# Patient Record
Sex: Female | Born: 1950 | Race: White | Hispanic: No | Marital: Married | Smoking: Never smoker
Health system: Southern US, Community
[De-identification: ages and names within clinical notes are randomized; demographics above are authoritative.]

## PROBLEM LIST (undated history)

## (undated) DIAGNOSIS — K219 Gastro-esophageal reflux disease without esophagitis: Secondary | ICD-10-CM

## (undated) DIAGNOSIS — Z9221 Personal history of antineoplastic chemotherapy: Secondary | ICD-10-CM

## (undated) DIAGNOSIS — Z9889 Other specified postprocedural states: Secondary | ICD-10-CM

## (undated) DIAGNOSIS — D649 Anemia, unspecified: Secondary | ICD-10-CM

## (undated) DIAGNOSIS — IMO0001 Reserved for inherently not codable concepts without codable children: Secondary | ICD-10-CM

## (undated) DIAGNOSIS — C541 Malignant neoplasm of endometrium: Secondary | ICD-10-CM

## (undated) DIAGNOSIS — IMO0002 Reserved for concepts with insufficient information to code with codable children: Secondary | ICD-10-CM

## (undated) DIAGNOSIS — K5790 Diverticulosis of intestine, part unspecified, without perforation or abscess without bleeding: Secondary | ICD-10-CM

## (undated) DIAGNOSIS — Z923 Personal history of irradiation: Secondary | ICD-10-CM

## (undated) HISTORY — PX: OVARIAN CYST REMOVAL: SHX89

## (undated) HISTORY — DX: Gastro-esophageal reflux disease without esophagitis: K21.9

## (undated) HISTORY — DX: Reserved for inherently not codable concepts without codable children: IMO0001

## (undated) HISTORY — PX: OTHER SURGICAL HISTORY: SHX169

## (undated) HISTORY — DX: Personal history of antineoplastic chemotherapy: Z92.21

## (undated) HISTORY — PX: BRAIN SURGERY: SHX531

## (undated) HISTORY — PX: TUBAL LIGATION: SHX77

## (undated) HISTORY — DX: Diverticulosis of intestine, part unspecified, without perforation or abscess without bleeding: K57.90

## (undated) HISTORY — PX: ABDOMINAL HYSTERECTOMY: SHX81

## (undated) HISTORY — DX: Reserved for concepts with insufficient information to code with codable children: IMO0002

## (undated) HISTORY — DX: Malignant neoplasm of endometrium: C54.1

## (undated) HISTORY — DX: Personal history of irradiation: Z92.3

## (undated) HISTORY — DX: Other specified postprocedural states: Z98.890

---

## 2014-01-01 ENCOUNTER — Ambulatory Visit: Payer: Self-pay | Admitting: Internal Medicine

## 2014-01-01 LAB — CBC CANCER CENTER
Bands: 3 %
Basophil: 1 %
HCT: 19.3 % — AB (ref 35.0–47.0)
HGB: 6.1 g/dL — AB (ref 12.0–16.0)
LYMPHS PCT: 15 %
MCH: 20.3 pg — AB (ref 26.0–34.0)
MCHC: 31.4 g/dL — AB (ref 32.0–36.0)
MCV: 65 fL — ABNORMAL LOW (ref 80–100)
Monocytes: 5 %
PLATELETS: 563 x10 3/mm — AB (ref 150–440)
RBC: 2.98 10*6/uL — ABNORMAL LOW (ref 3.80–5.20)
RDW: 19.9 % — ABNORMAL HIGH (ref 11.5–14.5)
SEGMENTED NEUTROPHILS: 76 %
WBC: 10.3 x10 3/mm (ref 3.6–11.0)

## 2014-01-01 LAB — FOLATE: FOLIC ACID: 9.6 ng/mL (ref 3.1–100.0)

## 2014-01-01 LAB — RETICULOCYTES
Absolute Retic Count: 0.0548 10*6/uL (ref 0.019–0.186)
Reticulocyte: 1.84 % (ref 0.4–3.1)

## 2014-01-01 LAB — IRON AND TIBC
Iron Bind.Cap.(Total): 375 ug/dL (ref 250–450)
Iron Saturation: 4 %
Iron: 14 ug/dL — ABNORMAL LOW (ref 50–170)
Unbound Iron-Bind.Cap.: 361 ug/dL

## 2014-01-01 LAB — FERRITIN: Ferritin (ARMC): 6 ng/mL — ABNORMAL LOW (ref 8–388)

## 2014-01-01 LAB — LACTATE DEHYDROGENASE: LDH: 149 U/L (ref 81–246)

## 2014-01-02 ENCOUNTER — Ambulatory Visit: Payer: Self-pay | Admitting: Gastroenterology

## 2014-01-03 ENCOUNTER — Ambulatory Visit: Payer: Self-pay | Admitting: Internal Medicine

## 2014-01-03 LAB — PROT IMMUNOELECTROPHORES(ARMC)

## 2014-01-07 ENCOUNTER — Ambulatory Visit: Payer: Self-pay | Admitting: Gastroenterology

## 2014-01-08 LAB — CBC CANCER CENTER
BASOS ABS: 0.1 x10 3/mm (ref 0.0–0.1)
Basophil %: 0.7 %
EOS PCT: 3.3 %
Eosinophil #: 0.3 x10 3/mm (ref 0.0–0.7)
HCT: 28.8 % — AB (ref 35.0–47.0)
HGB: 9 g/dL — ABNORMAL LOW (ref 12.0–16.0)
LYMPHS ABS: 1.2 x10 3/mm (ref 1.0–3.6)
LYMPHS PCT: 12.9 %
MCH: 22.5 pg — ABNORMAL LOW (ref 26.0–34.0)
MCHC: 31.2 g/dL — ABNORMAL LOW (ref 32.0–36.0)
MCV: 72 fL — ABNORMAL LOW (ref 80–100)
MONO ABS: 0.8 x10 3/mm (ref 0.2–0.9)
MONOS PCT: 8.5 %
Neutrophil #: 6.8 x10 3/mm — ABNORMAL HIGH (ref 1.4–6.5)
Neutrophil %: 74.6 %
Platelet: 502 x10 3/mm — ABNORMAL HIGH (ref 150–440)
RBC: 4.01 10*6/uL (ref 3.80–5.20)
RDW: 23.9 % — ABNORMAL HIGH (ref 11.5–14.5)
WBC: 9.1 x10 3/mm (ref 3.6–11.0)

## 2014-01-09 LAB — CA 125: CA 125: 29.6 U/mL (ref 0.0–34.0)

## 2014-01-21 LAB — PATHOLOGY REPORT

## 2014-01-23 ENCOUNTER — Ambulatory Visit: Payer: Self-pay | Admitting: Gynecologic Oncology

## 2014-01-23 DIAGNOSIS — D508 Other iron deficiency anemias: Secondary | ICD-10-CM

## 2014-01-23 LAB — BASIC METABOLIC PANEL
ANION GAP: 10 (ref 7–16)
BUN: 11 mg/dL (ref 7–18)
CO2: 28 mmol/L (ref 21–32)
CREATININE: 0.8 mg/dL (ref 0.60–1.30)
Calcium, Total: 9.1 mg/dL (ref 8.5–10.1)
Chloride: 99 mmol/L (ref 98–107)
EGFR (African American): >60
EGFR (Non-African Amer.): >60
GLUCOSE: 109 mg/dL — AB (ref 65–99)
OSMOLALITY: 274 (ref 275–301)
Potassium: 3.8 mmol/L (ref 3.5–5.1)
Sodium: 137 mmol/L (ref 136–145)

## 2014-01-23 LAB — HEMOGLOBIN: HGB: 9.2 g/dL — AB (ref 12.0–16.0)

## 2014-01-28 ENCOUNTER — Ambulatory Visit: Payer: Self-pay | Admitting: Gynecologic Oncology

## 2014-01-28 LAB — HEMOGLOBIN: HGB: 9.8 g/dL — ABNORMAL LOW (ref 12.0–16.0)

## 2014-02-03 ENCOUNTER — Ambulatory Visit: Payer: Self-pay | Admitting: Internal Medicine

## 2014-02-03 LAB — CBC CANCER CENTER
BASOS ABS: 0.1 x10 3/mm (ref 0.0–0.1)
BASOS PCT: 0.6 %
Eosinophil #: 0.5 x10 3/mm (ref 0.0–0.7)
Eosinophil %: 4.7 %
HCT: 27.2 % — ABNORMAL LOW (ref 35.0–47.0)
HGB: 8.4 g/dL — ABNORMAL LOW (ref 12.0–16.0)
LYMPHS ABS: 1.5 x10 3/mm (ref 1.0–3.6)
Lymphocyte %: 14.4 %
MCH: 23.3 pg — ABNORMAL LOW (ref 26.0–34.0)
MCHC: 31.1 g/dL — AB (ref 32.0–36.0)
MCV: 75 fL — ABNORMAL LOW (ref 80–100)
MONOS PCT: 9.3 %
Monocyte #: 1 x10 3/mm — ABNORMAL HIGH (ref 0.2–0.9)
NEUTROS PCT: 71 %
Neutrophil #: 7.3 x10 3/mm — ABNORMAL HIGH (ref 1.4–6.5)
Platelet: 422 x10 3/mm (ref 150–440)
RBC: 3.63 10*6/uL — AB (ref 3.80–5.20)
RDW: 25.3 % — ABNORMAL HIGH (ref 11.5–14.5)
WBC: 10.3 x10 3/mm (ref 3.6–11.0)

## 2014-02-03 LAB — COMPREHENSIVE METABOLIC PANEL
ALBUMIN: 3.4 g/dL (ref 3.4–5.0)
Alkaline Phosphatase: 83 U/L
Anion Gap: 12 (ref 7–16)
BUN: 8 mg/dL (ref 7–18)
Bilirubin,Total: 0.4 mg/dL (ref 0.2–1.0)
CHLORIDE: 99 mmol/L (ref 98–107)
Calcium, Total: 9.8 mg/dL (ref 8.5–10.1)
Co2: 29 mmol/L (ref 21–32)
Creatinine: 0.94 mg/dL (ref 0.60–1.30)
Glucose: 105 mg/dL — ABNORMAL HIGH (ref 65–99)
Osmolality: 278 (ref 275–301)
POTASSIUM: 3.4 mmol/L — AB (ref 3.5–5.1)
SGOT(AST): 11 U/L — ABNORMAL LOW (ref 15–37)
SGPT (ALT): 13 U/L (ref 12–78)
Sodium: 140 mmol/L (ref 136–145)
Total Protein: 8.2 g/dL (ref 6.4–8.2)

## 2014-02-05 LAB — CANCER CENTER HEMOGLOBIN: HGB: 8.4 g/dL — AB (ref 12.0–16.0)

## 2014-02-08 ENCOUNTER — Inpatient Hospital Stay: Payer: Self-pay | Admitting: Internal Medicine

## 2014-02-08 LAB — CBC
HCT: 23.1 % — AB (ref 35.0–47.0)
HGB: 7.2 g/dL — AB (ref 12.0–16.0)
MCH: 23.4 pg — ABNORMAL LOW (ref 26.0–34.0)
MCHC: 31.1 g/dL — ABNORMAL LOW (ref 32.0–36.0)
MCV: 75 fL — AB (ref 80–100)
Platelet: 381 10*3/uL (ref 150–440)
RBC: 3.08 10*6/uL — AB (ref 3.80–5.20)
RDW: 25.6 % — ABNORMAL HIGH (ref 11.5–14.5)
WBC: 10.8 10*3/uL (ref 3.6–11.0)

## 2014-02-08 LAB — BASIC METABOLIC PANEL
ANION GAP: 9 (ref 7–16)
BUN: 10 mg/dL (ref 7–18)
Calcium, Total: 8.5 mg/dL (ref 8.5–10.1)
Chloride: 105 mmol/L (ref 98–107)
Co2: 25 mmol/L (ref 21–32)
Creatinine: 0.93 mg/dL (ref 0.60–1.30)
EGFR (Non-African Amer.): >60
Glucose: 118 mg/dL — ABNORMAL HIGH (ref 65–99)
Osmolality: 278 (ref 275–301)
Potassium: 3.1 mmol/L — ABNORMAL LOW (ref 3.5–5.1)
Sodium: 139 mmol/L (ref 136–145)

## 2014-02-09 LAB — CBC WITH DIFFERENTIAL/PLATELET
BASOS ABS: 0 10*3/uL (ref 0.0–0.1)
Basophil %: 0.6 %
EOS ABS: 0.4 10*3/uL (ref 0.0–0.7)
EOS PCT: 4.8 %
HCT: 21.7 % — ABNORMAL LOW (ref 35.0–47.0)
HGB: 6.9 g/dL — ABNORMAL LOW (ref 12.0–16.0)
LYMPHS ABS: 1.3 10*3/uL (ref 1.0–3.6)
LYMPHS PCT: 14.2 %
MCH: 24.4 pg — ABNORMAL LOW (ref 26.0–34.0)
MCHC: 31.7 g/dL — AB (ref 32.0–36.0)
MCV: 77 fL — ABNORMAL LOW (ref 80–100)
MONO ABS: 0.3 x10 3/mm (ref 0.2–0.9)
Monocyte %: 3.6 %
Neutrophil #: 6.8 10*3/uL — ABNORMAL HIGH (ref 1.4–6.5)
Neutrophil %: 76.8 %
PLATELETS: 306 10*3/uL (ref 150–440)
RBC: 2.82 10*6/uL — ABNORMAL LOW (ref 3.80–5.20)
RDW: 25.7 % — ABNORMAL HIGH (ref 11.5–14.5)
WBC: 8.8 10*3/uL (ref 3.6–11.0)

## 2014-02-09 LAB — BASIC METABOLIC PANEL
Anion Gap: 8 (ref 7–16)
BUN: 11 mg/dL (ref 7–18)
Calcium, Total: 7.9 mg/dL — ABNORMAL LOW (ref 8.5–10.1)
Chloride: 106 mmol/L (ref 98–107)
Co2: 23 mmol/L (ref 21–32)
Creatinine: 0.61 mg/dL (ref 0.60–1.30)
EGFR (Non-African Amer.): >60
Glucose: 100 mg/dL — ABNORMAL HIGH (ref 65–99)
Osmolality: 273 (ref 275–301)
Potassium: 3.1 mmol/L — ABNORMAL LOW (ref 3.5–5.1)
Sodium: 137 mmol/L (ref 136–145)

## 2014-02-10 LAB — CBC WITH DIFFERENTIAL/PLATELET
BASOS PCT: 0.7 %
Basophil #: 0.1 10*3/uL (ref 0.0–0.1)
Eosinophil #: 0.7 10*3/uL (ref 0.0–0.7)
Eosinophil %: 7.4 %
HCT: 25.7 % — ABNORMAL LOW (ref 35.0–47.0)
HGB: 8.2 g/dL — ABNORMAL LOW (ref 12.0–16.0)
LYMPHS PCT: 9.9 %
Lymphocyte #: 0.9 10*3/uL — ABNORMAL LOW (ref 1.0–3.6)
MCH: 25.2 pg — ABNORMAL LOW (ref 26.0–34.0)
MCHC: 32 g/dL (ref 32.0–36.0)
MCV: 79 fL — ABNORMAL LOW (ref 80–100)
Monocyte #: 0.3 x10 3/mm (ref 0.2–0.9)
Monocyte %: 3.1 %
NEUTROS ABS: 7.6 10*3/uL — AB (ref 1.4–6.5)
NEUTROS PCT: 78.9 %
PLATELETS: 301 10*3/uL (ref 150–440)
RBC: 3.26 10*6/uL — AB (ref 3.80–5.20)
RDW: 23.7 % — ABNORMAL HIGH (ref 11.5–14.5)
WBC: 9.6 10*3/uL (ref 3.6–11.0)

## 2014-02-10 LAB — POTASSIUM: POTASSIUM: 3.2 mmol/L — AB (ref 3.5–5.1)

## 2014-02-11 LAB — CBC WITH DIFFERENTIAL/PLATELET
BASOS PCT: 0.4 %
Basophil #: 0 10*3/uL (ref 0.0–0.1)
EOS ABS: 0.8 10*3/uL — AB (ref 0.0–0.7)
Eosinophil %: 10 %
HCT: 26.2 % — ABNORMAL LOW (ref 35.0–47.0)
HGB: 8.5 g/dL — AB (ref 12.0–16.0)
Lymphocyte #: 1.2 10*3/uL (ref 1.0–3.6)
Lymphocyte %: 15.8 %
MCH: 25.3 pg — ABNORMAL LOW (ref 26.0–34.0)
MCHC: 32.3 g/dL (ref 32.0–36.0)
MCV: 78 fL — ABNORMAL LOW (ref 80–100)
Monocyte #: 0.4 x10 3/mm (ref 0.2–0.9)
Monocyte %: 5.1 %
NEUTROS PCT: 68.7 %
Neutrophil #: 5.2 10*3/uL (ref 1.4–6.5)
Platelet: 380 10*3/uL (ref 150–440)
RBC: 3.34 10*6/uL — ABNORMAL LOW (ref 3.80–5.20)
RDW: 24.2 % — AB (ref 11.5–14.5)
WBC: 7.6 10*3/uL (ref 3.6–11.0)

## 2014-02-12 LAB — BASIC METABOLIC PANEL
ANION GAP: 13 (ref 7–16)
BUN: 8 mg/dL (ref 7–18)
CHLORIDE: 100 mmol/L (ref 98–107)
CREATININE: 0.86 mg/dL (ref 0.60–1.30)
Calcium, Total: 9.1 mg/dL (ref 8.5–10.1)
Co2: 22 mmol/L (ref 21–32)
EGFR (African American): >60
Glucose: 126 mg/dL — ABNORMAL HIGH (ref 65–99)
Osmolality: 270 (ref 275–301)
Potassium: 3.5 mmol/L (ref 3.5–5.1)
SODIUM: 135 mmol/L — AB (ref 136–145)

## 2014-02-12 LAB — CBC CANCER CENTER
BASOS PCT: 0.4 %
Basophil #: 0 x10 3/mm (ref 0.0–0.1)
Eosinophil #: 0 x10 3/mm (ref 0.0–0.7)
Eosinophil %: 0 %
HCT: 27.4 % — AB (ref 35.0–47.0)
HGB: 8.9 g/dL — ABNORMAL LOW (ref 12.0–16.0)
Lymphocyte #: 0.4 x10 3/mm — ABNORMAL LOW (ref 1.0–3.6)
Lymphocyte %: 7.1 %
MCH: 25.2 pg — ABNORMAL LOW (ref 26.0–34.0)
MCHC: 32.4 g/dL (ref 32.0–36.0)
MCV: 78 fL — ABNORMAL LOW (ref 80–100)
Monocyte #: 0.2 x10 3/mm (ref 0.2–0.9)
Monocyte %: 4.1 %
NEUTROS ABS: 4.4 x10 3/mm (ref 1.4–6.5)
Neutrophil %: 88.4 %
Platelet: 426 x10 3/mm (ref 150–440)
RBC: 3.52 10*6/uL — ABNORMAL LOW (ref 3.80–5.20)
RDW: 24.8 % — ABNORMAL HIGH (ref 11.5–14.5)
WBC: 5 x10 3/mm (ref 3.6–11.0)

## 2014-02-14 ENCOUNTER — Inpatient Hospital Stay: Payer: Self-pay | Admitting: Internal Medicine

## 2014-02-14 LAB — BASIC METABOLIC PANEL
Anion Gap: 9 (ref 7–16)
BUN: 15 mg/dL (ref 7–18)
CALCIUM: 8.5 mg/dL (ref 8.5–10.1)
CHLORIDE: 106 mmol/L (ref 98–107)
CREATININE: 0.88 mg/dL (ref 0.60–1.30)
Co2: 24 mmol/L (ref 21–32)
EGFR (African American): >60
EGFR (Non-African Amer.): >60
Glucose: 96 mg/dL (ref 65–99)
Osmolality: 278 (ref 275–301)
Potassium: 3.4 mmol/L — ABNORMAL LOW (ref 3.5–5.1)
Sodium: 139 mmol/L (ref 136–145)

## 2014-02-14 LAB — CBC
HCT: 25.3 % — ABNORMAL LOW (ref 35.0–47.0)
HGB: 8.1 g/dL — ABNORMAL LOW (ref 12.0–16.0)
MCH: 25.4 pg — ABNORMAL LOW (ref 26.0–34.0)
MCHC: 32 g/dL (ref 32.0–36.0)
MCV: 80 fL (ref 80–100)
Platelet: 362 10*3/uL (ref 150–440)
RBC: 3.18 10*6/uL — ABNORMAL LOW (ref 3.80–5.20)
RDW: 25 % — ABNORMAL HIGH (ref 11.5–14.5)
WBC: 8.9 10*3/uL (ref 3.6–11.0)

## 2014-02-15 ENCOUNTER — Encounter: Payer: Self-pay | Admitting: Radiation Oncology

## 2014-02-15 ENCOUNTER — Ambulatory Visit
Admission: RE | Admit: 2014-02-15 | Discharge: 2014-02-15 | Disposition: A | Discharge status: Home / Self Care | Payer: MEDICAID | Source: Ambulatory Visit | Attending: Radiation Oncology | Admitting: Radiation Oncology

## 2014-02-15 ENCOUNTER — Inpatient Hospital Stay (HOSPITAL_COMMUNITY)
Admission: EM | Admit: 2014-02-15 | Discharge: 2014-02-16 | DRG: 812 | Disposition: A | Discharge status: Home / Self Care | Payer: Self-pay | Source: Other Acute Inpatient Hospital | Attending: Internal Medicine | Admitting: Internal Medicine

## 2014-02-15 ENCOUNTER — Inpatient Hospital Stay: Admit: 2014-02-15 | Discharge: 2014-02-15 | Disposition: A | Discharge status: Home / Self Care | Payer: Self-pay

## 2014-02-15 ENCOUNTER — Encounter (HOSPITAL_COMMUNITY): Payer: Self-pay

## 2014-02-15 DIAGNOSIS — D62 Acute posthemorrhagic anemia: Principal | ICD-10-CM | POA: Diagnosis present

## 2014-02-15 DIAGNOSIS — E876 Hypokalemia: Secondary | ICD-10-CM

## 2014-02-15 DIAGNOSIS — F341 Dysthymic disorder: Secondary | ICD-10-CM | POA: Diagnosis present

## 2014-02-15 DIAGNOSIS — Z923 Personal history of irradiation: Secondary | ICD-10-CM

## 2014-02-15 DIAGNOSIS — R630 Anorexia: Secondary | ICD-10-CM | POA: Diagnosis present

## 2014-02-15 DIAGNOSIS — F418 Other specified anxiety disorders: Secondary | ICD-10-CM | POA: Diagnosis present

## 2014-02-15 DIAGNOSIS — N898 Other specified noninflammatory disorders of vagina: Secondary | ICD-10-CM | POA: Diagnosis present

## 2014-02-15 DIAGNOSIS — N939 Abnormal uterine and vaginal bleeding, unspecified: Secondary | ICD-10-CM | POA: Diagnosis present

## 2014-02-15 DIAGNOSIS — D500 Iron deficiency anemia secondary to blood loss (chronic): Secondary | ICD-10-CM

## 2014-02-15 DIAGNOSIS — C549 Malignant neoplasm of corpus uteri, unspecified: Secondary | ICD-10-CM | POA: Diagnosis present

## 2014-02-15 DIAGNOSIS — C541 Malignant neoplasm of endometrium: Secondary | ICD-10-CM | POA: Diagnosis present

## 2014-02-15 HISTORY — DX: Anemia, unspecified: D64.9

## 2014-02-15 LAB — COMPREHENSIVE METABOLIC PANEL
ALK PHOS: 55 U/L (ref 39–117)
ALT: 16 U/L (ref 0–35)
AST: 17 U/L (ref 0–37)
Albumin: 3 g/dL — ABNORMAL LOW (ref 3.5–5.2)
BUN: 9 mg/dL (ref 6–23)
CALCIUM: 8.7 mg/dL (ref 8.4–10.5)
CO2: 23 mEq/L (ref 19–32)
Chloride: 101 mEq/L (ref 96–112)
Creatinine, Ser: 0.64 mg/dL (ref 0.50–1.10)
GFR calc non Af Amer: >90 mL/min (ref >90)
Glucose, Bld: 107 mg/dL — ABNORMAL HIGH (ref 70–99)
Potassium: 3.3 mEq/L — ABNORMAL LOW (ref 3.7–5.3)
SODIUM: 138 meq/L (ref 137–147)
Total Bilirubin: 0.8 mg/dL (ref 0.3–1.2)
Total Protein: 6.4 g/dL (ref 6.0–8.3)

## 2014-02-15 LAB — ABO/RH: ABO/RH(D): O POS

## 2014-02-15 LAB — CBC
HCT: 25.8 % — ABNORMAL LOW (ref 36.0–46.0)
Hemoglobin: 8.5 g/dL — ABNORMAL LOW (ref 12.0–15.0)
MCH: 25.5 pg — ABNORMAL LOW (ref 26.0–34.0)
MCHC: 32.9 g/dL (ref 30.0–36.0)
MCV: 77.5 fL — ABNORMAL LOW (ref 78.0–100.0)
Platelets: 337 10*3/uL (ref 150–400)
RBC: 3.33 MIL/uL — ABNORMAL LOW (ref 3.87–5.11)
RDW: 21.6 % — ABNORMAL HIGH (ref 11.5–15.5)
WBC: 6.3 10*3/uL (ref 4.0–10.5)

## 2014-02-15 LAB — MAGNESIUM: MAGNESIUM: 1.7 mg/dL (ref 1.5–2.5)

## 2014-02-15 LAB — TYPE AND SCREEN
ABO/RH(D): O POS
ANTIBODY SCREEN: NEGATIVE

## 2014-02-15 MED ORDER — FLUOXETINE HCL 10 MG PO CAPS
10.0000 mg | ORAL_CAPSULE | Freq: Every day | ORAL | Status: DC
  Administered 2014-02-15 – 2014-02-16 (×2): 10 mg via ORAL
  Filled 2014-02-15 (×2): qty 1

## 2014-02-15 MED ORDER — HYDROCODONE-ACETAMINOPHEN 5-325 MG PO TABS
1.0000 | ORAL_TABLET | ORAL | Status: DC | PRN
  Administered 2014-02-15 – 2014-02-16 (×6): 1 via ORAL
  Filled 2014-02-15 (×6): qty 1

## 2014-02-15 MED ORDER — PANTOPRAZOLE SODIUM 40 MG PO TBEC
40.0000 mg | DELAYED_RELEASE_TABLET | Freq: Every day | ORAL | Status: DC
  Administered 2014-02-15 – 2014-02-16 (×2): 40 mg via ORAL
  Filled 2014-02-15 (×3): qty 1

## 2014-02-15 MED ORDER — MORPHINE SULFATE 2 MG/ML IJ SOLN: 1.0000 mg | INTRAMUSCULAR | Status: DC | PRN

## 2014-02-15 MED ORDER — SODIUM CHLORIDE 0.9 % IV SOLN
INTRAVENOUS | Status: AC
  Administered 2014-02-15: 07:00:00 via INTRAVENOUS

## 2014-02-15 MED ORDER — ACETAMINOPHEN 325 MG PO TABS: 650.0000 mg | ORAL_TABLET | Freq: Four times a day (QID) | ORAL | Status: DC | PRN

## 2014-02-15 MED ORDER — MEGESTROL ACETATE 40 MG PO TABS
40.0000 mg | ORAL_TABLET | Freq: Two times a day (BID) | ORAL | Status: DC
  Administered 2014-02-15 – 2014-02-16 (×3): 40 mg via ORAL
  Filled 2014-02-15 (×5): qty 1

## 2014-02-15 MED ORDER — DOCUSATE SODIUM 100 MG PO CAPS
100.0000 mg | ORAL_CAPSULE | Freq: Two times a day (BID) | ORAL | Status: DC
  Administered 2014-02-15: 100 mg via ORAL
  Filled 2014-02-15 (×4): qty 1

## 2014-02-15 MED ORDER — SODIUM CHLORIDE 0.9 % IJ SOLN
3.0000 mL | Freq: Two times a day (BID) | INTRAMUSCULAR | Status: DC
  Administered 2014-02-15: 3 mL via INTRAVENOUS

## 2014-02-15 MED ORDER — ONDANSETRON HCL 4 MG PO TABS: 4.0000 mg | ORAL_TABLET | Freq: Four times a day (QID) | ORAL | Status: DC | PRN

## 2014-02-15 MED ORDER — ACETAMINOPHEN 650 MG RE SUPP: 650.0000 mg | Freq: Four times a day (QID) | RECTAL | Status: DC | PRN

## 2014-02-15 MED ORDER — POTASSIUM CHLORIDE CRYS ER 20 MEQ PO TBCR
40.0000 meq | EXTENDED_RELEASE_TABLET | Freq: Once | ORAL | Status: DC
  Filled 2014-02-15: qty 2

## 2014-02-15 MED ORDER — LORAZEPAM 0.5 MG PO TABS: 0.5000 mg | ORAL_TABLET | Freq: Three times a day (TID) | ORAL | Status: DC | PRN

## 2014-02-15 MED ORDER — ONDANSETRON HCL 4 MG/2ML IJ SOLN: 4.0000 mg | Freq: Four times a day (QID) | INTRAMUSCULAR | Status: DC | PRN

## 2014-02-15 MED ORDER — POTASSIUM CHLORIDE CRYS ER 20 MEQ PO TBCR
40.0000 meq | EXTENDED_RELEASE_TABLET | ORAL | Status: AC
  Administered 2014-02-15 (×2): 40 meq via ORAL
  Filled 2014-02-15 (×2): qty 2

## 2014-02-15 NOTE — Progress Notes (Signed)
[facility] Progress Note   Kathy Wagner BDZ:[id number] DOB: [birth year] DOA: 02/15/2014 PCP: Toy Baker, MD  Brief narrative: Kathy Wagner is a 63 y.o. female presenting on 02/15/2014 AM from [facility] with extensive vaginal bleeding and anemia. She has a h/o endometrial CA being treated by Oncology with chemo. Wednesday was her 2nd cycle. She has heavy vaginal bleeding after chemo and required a transfusion after her first chemo last week. She was given a unit of PRBC last night as well at [facility] and was sent to [facility] for radiation of the tumor. She was scheduled for it on Monday but due to the heavy bleeding, her oncologist recommended it be done as soon as possible.    Subjective: Having sharp mid and lower abdominal pain. States it sometimes gets better with her PPI so is waiting for her PPI before taking pain medications.   Assessment/Plan: Principal Problem:   Acute blood loss anemia - Hgb now > 8. She states bleeding is very light right now therefore will check Hgb in AM rather than again today.   Active Problems:   Vaginal bleeding - slowed down as mentioned above    Endometrial cancer - initially diagnosed in 2012 - chemo started last wk - her oncologist want radiation to be started ASAP - I have called Rad Onc    Depression with anxiety - has tried 2 doses of Effexor, does not like it and would like Prozac which she took may yrs ago     Anorexia - on Megace    Code Status: Full code Family Communication: with husband Disposition Plan: home  Consultants: Oncology  Procedures: none  Antibiotics: Antibiotics Given (last 72 hours)   None       DVT prophylaxis: SCDs due to bleeding  Objective: Filed Weights   02/15/14 0321  Weight: 69.1 kg (152 lb 5.4 oz)    Vitals Filed Vitals:   02/15/14 0321 02/15/14 0500  BP: 123/61 134/62  Pulse: 73 75  Temp: 99 F (37.2 C) 98.8 F (37.1 C)  TempSrc: Oral Oral  Resp: 18 18   Height: 5\' 6"  (1.676 m)   Weight: 69.1 kg (152 lb 5.4 oz)   SpO2: 100% 100%     Intake/Output Summary (Last 24 hours) at 02/15/14 0934 Last data filed at 02/15/14 0751  Gross per 24 hour  Intake      0 ml  Output    650 ml  Net   -650 ml     Exam: General: mild distress intermittent due to pain, AAO x 3 Lungs: Clear to auscultation bilaterally without wheezes or crackles Cardiovascular: Regular rate and rhythm without murmur gallop or rub normal S1 and S2 Abdomen: tender in mid abdomen and suprapubic area, nondistended, soft, bowel sounds positive, no rebound, no ascites, no appreciable mass Extremities: No significant cyanosis, clubbing, or edema bilateral lower extremities  Data Reviewed: Basic Metabolic Panel:  Recent Labs Lab 02/15/14 0506  NA 138  K 3.3*  CL 101  CO2 23  GLUCOSE 107*  BUN 9  CREATININE 0.64  CALCIUM 8.7  MG 1.7   Liver Function Tests:  Recent Labs Lab 02/15/14 0506  AST 17  ALT 16  ALKPHOS 55  BILITOT 0.8  PROT 6.4  ALBUMIN 3.0*   No results found for this basename: LIPASE, AMYLASE,  in the last 168 hours No results found for this basename: AMMONIA,  in the last 168 hours CBC:  Recent Labs Lab 02/15/14 0506  WBC 6.3  HGB 8.5*  HCT 25.8*  MCV 77.5*  PLT 337   Cardiac Enzymes: No results found for this basename: CKTOTAL, CKMB, CKMBINDEX, TROPONINI,  in the last 168 hours BNP (last 3 results) No results found for this basename: PROBNP,  in the last 8760 hours CBG: No results found for this basename: GLUCAP,  in the last 168 hours  No results found for this or any previous visit (from the past 240 hour(s)).   Studies:  Recent x-ray studies have been reviewed in detail by the Attending Physician  Scheduled Meds:  Scheduled Meds: . docusate sodium  100 mg Oral BID  . FLUoxetine  10 mg Oral Daily  . megestrol  40 mg Oral BID  . pantoprazole  40 mg Oral Daily  . potassium chloride  40 mEq Oral Q4H  . sodium chloride   3 mL Intravenous Q12H   Continuous Infusions: . sodium chloride 75 mL/hr at 02/15/14 1031    Time spent on care of this patient: 35 min   [place], MD 02/15/2014, 9:34 AM  LOS: 0 days   [facility] Office  [phone] Pager - Text Page per Shea Evans   If 7PM-7AM, please contact night-coverage [email or web address]

## 2014-02-15 NOTE — Therapy (Signed)
[facility] Therapy Treatment Record Phone [phone]   Radiation Therapy was administered to Kathy Wagner on: 02/15/2014  12:22 PM and was treatment # 1 out of a planned course of 2 treatments.

## 2014-02-15 NOTE — H&P (Addendum)
PCP: none Gyn Dr. Sabra Heck Oncology: Ma Hillock  Chief Complaint:  Vaginal bleeding  HPI: Kathy Wagner is a 63 y.o. female   has a past medical history of Anemia.   Patietn was diagnosed with Endometrial Cancer 5/12. She was started on chemotherapy and have had two cycles last one was 6/10.  She have had continuous bleeding since April but the episodes of severe bleeding would corresponded with chemo therapy. She have had previous 2 severe episodes requiring blood transfusions. For the past 2 days she have had worsening bleeding with heavy clots. She presented to ER and was found to be anemia with Hg of 8.1 she presented to [facility] and was given 1 unit of PRBC.  She was initially supposed to get Radiation therapy this Monday 6/15. Her oncologist and GYN recommended expedited radiation therapy. She was transferred to [facility] with plan to have Rad/onc consult in AM to start Radiation. She denies any chest pain and shortness of breath. She have had some abdominal discomfort that is now improving. Vaginal bleeding has improved and since her arrival to the floor she have soaked one pad only.  Hospitalist was called for admission for  Vaginal bleeding in the setting of Endometrial cancer  Review of Systems:    Pertinent positives include:  abdominal pain, Vaginal bleeding, fatigue, lower abdominal pain  Constitutional:  No weight loss, night sweats, Fevers, chills,  weight loss  HEENT:  No headaches, Difficulty swallowing,Tooth/dental problems,Sore throat,  No sneezing, itching, ear ache, nasal congestion, post nasal drip,  Cardio-vascular:  No chest pain, Orthopnea, PND, anasarca, dizziness, palpitations.no Bilateral lower extremity swelling  GI:  No heartburn, indigestion, nausea, vomiting, diarrhea, change in bowel habits, loss of appetite, melena, blood in stool, hematemesis Resp:  no shortness of breath at rest. No dyspnea on exertion, No excess mucus, no productive cough, No  non-productive cough, No coughing up of blood.No change in color of mucus.No wheezing. Skin:  no rash or lesions. No jaundice GU:  no dysuria, change in color of urine, no urgency or frequency. No straining to urinate.  No flank pain.  Musculoskeletal:  No joint pain or no joint swelling. No decreased range of motion. No back pain.  Psych:  No change in mood or affect. No depression or anxiety. No memory loss.  Neuro: no localizing neurological complaints, no tingling, no weakness, no double vision, no gait abnormality, no slurred speech, no confusion  Otherwise ROS are negative except for above, 10 systems were reviewed  Past Medical History: Past Medical History  Diagnosis Date  . Anemia    Past Surgical History  Procedure Laterality Date  . Ovarian cyst removal  x 3     Medications: Prior to Admission medications   Medication Sig Start Date End Date Taking? Authorizing Provider  HYDROcodone-acetaminophen (NORCO/VICODIN) 5-325 MG per tablet Take 1 tablet by mouth every 6 (six) hours as needed for moderate pain.   Yes Historical Provider, MD  LORazepam (ATIVAN) 0.5 MG tablet Take 0.5 mg by mouth every 8 (eight) hours as needed for anxiety.   Yes Historical Provider, MD  megestrol (MEGACE) 40 MG tablet Take 40 mg by mouth 2 (two) times daily.   Yes Historical Provider, MD  omeprazole (PRILOSEC) 20 MG capsule Take 20 mg by mouth daily.   Yes Historical Provider, MD  sodium chloride (OCEAN) 0.65 % SOLN nasal spray Place 1 spray into both nostrils as needed for congestion.   Yes Historical Provider, MD    Allergies:  Allergies  Allergen Reactions  . Azithromycin Swelling    Throat swells  . Codeine Nausea And Vomiting  . Gluten Meal Other (See Comments)    Cramping & pain  . Oxycodone Nausea And Vomiting  . Soy Allergy Other (See Comments)    Patient states that she does not want soy because it can increase estrogen and make her bleed  . Tramadol Nausea And Vomiting     Social History:  Ambulatory   Independently  Lives at home  With family     reports that she has never smoked. She does not have any smokeless tobacco history on file. She reports that she does not drink alcohol or use illicit drugs.    Family History: family history includes Hypertension in her father and mother.    Physical Exam: Patient Vitals for the past 24 hrs:  BP Temp Temp src Pulse Resp SpO2 Height Weight  02/15/14 0321 123/61 mmHg 99 F (37.2 C) Oral 73 18 100 % 5\' 6"  (1.676 m) 69.1 kg (152 lb 5.4 oz)    1. General:  in No Acute distress 2. Psychological: Alert and   Oriented 3. Head/ENT:    Dry Mucous Membranes                          Head Non traumatic, neck supple                          Normal  Dentition 4. SKIN:   decreased Skin turgor,  Skin clean Dry and intact no rash 5. Heart: Regular rate and rhythm no Murmur, Rub or gallop 6. Lungs: Clear to auscultation bilaterally, no wheezes or crackles   7. Abdomen: Soft, some supra pubic tenderness, Non distended 8. Lower extremities: no clubbing, cyanosis, or edema 9. Neurologically Grossly intact, moving all 4 extremities equally 10. MSK: Normal range of motion  body mass index is 24.6 kg/(m^2).   Labs on Admission:   Recent Labs  02/15/14 0506  NA 138  K 3.3*  CL 101  CO2 23  GLUCOSE 107*  BUN 9  CREATININE 0.64  CALCIUM 8.7    Recent Labs  02/15/14 0506  AST 17  ALT 16  ALKPHOS 55  BILITOT 0.8  PROT 6.4  ALBUMIN 3.0*   No results found for this basename: LIPASE, AMYLASE,  in the last 72 hours  Recent Labs  02/15/14 0506  WBC 6.3  HGB 8.5*  HCT 25.8*  MCV 77.5*  PLT 337   No results found for this basename: CKTOTAL, CKMB, CKMBINDEX, TROPONINI,  in the last 72 hours No results found for this basename: TSH, T4TOTAL, FREET3, T3FREE, THYROIDAB,  in the last 72 hours No results found for this basename: VITAMINB12, FOLATE, FERRITIN, TIBC, IRON, RETICCTPCT,  in the last 72  hours No results found for this basename: HGBA1C    Estimated Creatinine Clearance: 67.4 ml/min (by C-G formula based on Cr of 0.64). ABG No results found for this basename: phart, pco2, po2, hco3, tco2, acidbasedef, o2sat     No results found for this basename: DDIMER    BNP (last 3 results) No results found for this basename: PROBNP,  in the last 8760 hours  Filed Weights   02/15/14 0321  Weight: 69.1 kg (152 lb 5.4 oz)     Cultures: No results found for this basename: sdes, specrequest, cult, reptstatus    Radiological Exams on Admission: No  results found.  Chart has been reviewed  Assessment/Plan  63 yo F with hx of endometrial Cancer transferred to [facility] for radiation therapy from [facility]  Present on Admission:  . Vaginal bleeding - improved somewhat, plan for rad?onc consult in AM . Anemia due to chronic blood loss - continue to monitor Hg . Endometrial cancer - spoke to Oncology who will see patient in consult. Will need to call radiation oncology in AM Hypokalemia  - will replace   Prophylaxis: SCD, Protonix  CODE STATUS:  FULL CODE    Other plan as per orders.  I have spent a total of 55 min on this admission  Jatorian Renault 02/15/2014, 6:14 AM  [facility]  Pager [phone]   If 7AM-7PM, please contact the day team taking care of the patient  [email or web address]  Password [email or web address]

## 2014-02-15 NOTE — Progress Notes (Signed)
Radiation Oncology         [phone] ________________________________  Initial inpatient Consultation  Name: Kathy Wagner MRN: [id number]  Date: 02/15/2014  DOB: May 17, 1951  [id number], MD  No ref. provider found   REFERRING PHYSICIAN: Toy Baker  DIAGNOSIS: Locally advanced endometrial cancer   HISTORY OF PRESENT ILLNESS::Kathy Wagner is a 64 y.o. female who is seen out courtesy of Dr. Nyoka Lint for consideration for emergent radiation therapy as part management of patient's advanced endometrial cancer. The patient reports  a 2 year history of intermittent vaginal bleeding and pelvic pain.  Vaginal bleeding worsen earlier this year and she underwent medical evaluation. Details are pending at this time. The patient  underwent endometrial biopsy which reportedly revealed the carcinoma.  According to the patient this was performed by Dr. Veryl Speak. Patient's pathology report is not available for review at this time. The records have been requested.  I was able to review of patient's imaging studies including a MRI of the pelvis as well as CT scan of the chest abdomen and pelvis performed at the [facility].  on MRI of the pelvis the patient was found to have a large endometrial mass which showed extrauterine extension into the left posterior fundus with apparent involvement of the adjacent sigmoid colon and left fallopian tube (FIGO stage IVA). Patient was noted to have a left external iliac lymphadenopathy suspicious for metastasis as well as a right external iliac lymph node suspicious for metastasis. CT scan of the the body showed no obvious extension outside the pelvis. According to patient she is received 2 cycles of chemotherapy. She's continued to have significant bleeding despite her chemotherapy. According to patient there no immediate plans for surgery given the extent of her disease but possible surgical intervention with tumor  shrinkage from chemotherapy and radiation treatments. Patient underwent simulation and planning for radiation treatments directed at the pelvis on June 11 at the [facility] under the direction of Dr. Noreene Filbert. Patient was scheduled to begin radiation treatments on June 15 as an outpatient. On Friday the patient began having significant vaginal bleeding with passage of large clots. She presented to the emergency room and was admitted to the hospital. The patient did require transfusion. Earlier this morning the patient was transferred to [facility] to proceed with urgent radiation therapy in light of her significant vaginal bleeding.Marland Kitchen  PREVIOUS RADIATION THERAPY: No  PAST MEDICAL HISTORY:  has a past medical history of Anemia.    PAST SURGICAL HISTORY: Past Surgical History  Procedure Laterality Date  . Ovarian cyst removal  x 3    FAMILY HISTORY: family history includes Hypertension in her father and mother.  SOCIAL HISTORY:  reports that she has never smoked. She does not have any smokeless tobacco history on file. She reports that she does not drink alcohol or use illicit drugs. lives in the [place] area with her husband. She is accompanied by her husband on evaluation today  ALLERGIES: Azithromycin; Codeine; Gluten meal; Oxycodone; Soy allergy; and Tramadol  MEDICATIONS:  No current facility-administered medications for this visit.   No current outpatient prescriptions on file.   Facility-Administered Medications Ordered in Other Visits  Medication Dose Route Frequency Provider Last Rate Last Dose  . 0.9 %  sodium chloride infusion   Intravenous Continuous Toy Baker, MD 75 mL/hr at 02/15/14 0653    . acetaminophen (TYLENOL) tablet 650 mg  650 mg Oral Q6H PRN Toy Baker, MD  Or  . acetaminophen (TYLENOL) suppository 650 mg  650 mg Rectal Q6H PRN Toy Baker, MD      . docusate sodium (COLACE) capsule 100 mg  100 mg Oral  BID Toy Baker, MD   100 mg at 02/15/14 0859  . FLUoxetine (PROZAC) capsule 10 mg  10 mg Oral Daily Debbe Odea, MD      . HYDROcodone-acetaminophen (NORCO/VICODIN) 5-325 MG per tablet 1 tablet  1 tablet Oral Q4H PRN Toy Baker, MD   1 tablet at 02/15/14 0950  . LORazepam (ATIVAN) tablet 0.5 mg  0.5 mg Oral Q8H PRN Toy Baker, MD      . megestrol (MEGACE) tablet 40 mg  40 mg Oral BID Toy Baker, MD   40 mg at 02/15/14 0900  . morphine 2 MG/ML injection 1 mg  1 mg Intravenous Q4H PRN Toy Baker, MD      . ondansetron (ZOFRAN) tablet 4 mg  4 mg Oral Q6H PRN Toy Baker, MD       Or  . ondansetron (ZOFRAN) injection 4 mg  4 mg Intravenous Q6H PRN Toy Baker, MD      . pantoprazole (PROTONIX) EC tablet 40 mg  40 mg Oral Daily Toy Baker, MD   40 mg at 02/15/14 0950  . potassium chloride SA (K-DUR,KLOR-CON) CR tablet 40 mEq  40 mEq Oral Q4H Debbe Odea, MD   40 mEq at 02/15/14 0859  . sodium chloride 0.9 % injection 3 mL  3 mL Intravenous Q12H Toy Baker, MD        REVIEW OF SYSTEMS:  A 15 point review of systems is documented in the electronic medical record. This was obtained by the nursing staff. However, I reviewed this with the patient to discuss relevant findings and make appropriate changes.  The patient reports several month history of severe pelvic pain which actually is worsened with eating. Eating prompts cramping and vaginal bleeding.  She denies any cough or breathing problems. She is unsure whether she has blood in her urine given the continued vaginal bleeding. She is unsure whether she has rectal bleeding in light of her continued vaginal bleeding.   PHYSICAL EXAM:  Vitals - 1 value per visit 5/40/9811  SYSTOLIC 914  DIASTOLIC 68  Pulse 96  Temperature 98.5  Respirations 16  Weight (lb) 152.34  Height 5\' 6"   BMI 24.6  Is a very pleasant 63 year old female in no acute distress. She is lying in a hospital bed.  She has somewhat of a pale appearance. She responds promptly to questions and is knowledgeable about her treatment thus far. The pupils are equal round and reactive to light. The extraocular eye movements are intact. The tongue is midline.  No palpable supraclavicular or axillary adenopathy. The lungs are clear to auscultation. The heart has regular rhythm and rate. The abdomen is soft with normal bowel sounds. Patient has some mild tenderness with palpation in the suprapubic and lower pelvis region. No rebound or guarding is noted. Blood is noted along the perineum. A pelvic exam as not performed as this may worsen her bleeding. On neurological examination motor strength is 5 out of 5 in the proximal and distal muscle groups of the upper and lower extremities. Peripheral pulses are good. Mild edema in the ankle and foot areas.     ECOG = 2  LABORATORY DATA:  Lab Results  Component Value Date   WBC 6.3 02/15/2014   HGB 8.5* 02/15/2014   HCT 25.8* 02/15/2014   MCV  77.5* 02/15/2014   PLT 337 02/15/2014   Lab Results  Component Value Date   NA 138 02/15/2014   K 3.3* 02/15/2014   CL 101 02/15/2014   CO2 23 02/15/2014   GLUCOSE 107* 02/15/2014   CREATININE 0.64 02/15/2014   CALCIUM 8.7 02/15/2014      RADIOGRAPHY: Summarized in the history of present illness    IMPRESSION: Locally advanced,  possibly stage IV  endometrial cancer. Patient has had significant problems with vaginal bleeding with passage of  large clots. Patient would be an appropriate candidate for urgent treatment directed at the pelvis area in an attempt to slow the vaginal bleeding.  I discussed the treatment course side effects and potential toxicities of urgent radiation therapy with the patient and her husband. She appears to understand and wishes to proceed with treatment.  PLAN: Simulation planning and her first treatment later today. Anticipate 3 Gy per fraction with treatments today and tomorrow. If the patient is stable for  discharge then she can resume her radiation therapy as an outpatient at the [facility] on June 15.  I spent 60 minutes minutes face to face with the patient and more than 50% of that time was spent in counseling and/or coordination of care.   ------------------------------------------------  Blair Promise, PhD, MD

## 2014-02-16 ENCOUNTER — Inpatient Hospital Stay: Admit: 2014-02-16 | Discharge: 2014-02-16 | Disposition: A | Discharge status: Home / Self Care | Payer: Self-pay

## 2014-02-16 ENCOUNTER — Encounter: Payer: Self-pay | Admitting: Radiation Oncology

## 2014-02-16 LAB — COMPREHENSIVE METABOLIC PANEL
ALBUMIN: 2.8 g/dL — AB (ref 3.5–5.2)
ALT: 18 U/L (ref 0–35)
AST: 18 U/L (ref 0–37)
Alkaline Phosphatase: 53 U/L (ref 39–117)
BUN: 9 mg/dL (ref 6–23)
CO2: 21 mEq/L (ref 19–32)
CREATININE: 0.62 mg/dL (ref 0.50–1.10)
Calcium: 8.9 mg/dL (ref 8.4–10.5)
Chloride: 103 mEq/L (ref 96–112)
GFR calc non Af Amer: >90 mL/min (ref >90)
GLUCOSE: 105 mg/dL — AB (ref 70–99)
Potassium: 3.9 mEq/L (ref 3.7–5.3)
Sodium: 138 mEq/L (ref 137–147)
TOTAL PROTEIN: 6.2 g/dL (ref 6.0–8.3)
Total Bilirubin: 0.5 mg/dL (ref 0.3–1.2)

## 2014-02-16 LAB — CBC
HEMATOCRIT: 25.5 % — AB (ref 36.0–46.0)
Hemoglobin: 8.5 g/dL — ABNORMAL LOW (ref 12.0–15.0)
MCH: 25.6 pg — ABNORMAL LOW (ref 26.0–34.0)
MCHC: 33.3 g/dL (ref 30.0–36.0)
MCV: 76.8 fL — AB (ref 78.0–100.0)
Platelets: 323 10*3/uL (ref 150–400)
RBC: 3.32 MIL/uL — AB (ref 3.87–5.11)
RDW: 22.3 % — ABNORMAL HIGH (ref 11.5–15.5)
WBC: 5 10*3/uL (ref 4.0–10.5)

## 2014-02-16 LAB — PHOSPHORUS: Phosphorus: 3.5 mg/dL (ref 2.3–4.6)

## 2014-02-16 LAB — MAGNESIUM: MAGNESIUM: 1.8 mg/dL (ref 1.5–2.5)

## 2014-02-16 MED ORDER — MEGESTROL ACETATE 40 MG PO TABS: 40.0000 mg | ORAL_TABLET | Freq: Two times a day (BID) | ORAL | Status: DC

## 2014-02-16 MED ORDER — FLUOXETINE HCL 10 MG PO CAPS: 10.0000 mg | ORAL_CAPSULE | Freq: Every day | ORAL | Status: DC

## 2014-02-16 NOTE — Discharge Summary (Signed)
Physician Discharge Summary  Kathy Wagner HQP:[id number] DOB: 1950/12/24 DOA: 02/15/2014  PCP: Toy Baker, MD  Admit date: 02/15/2014 Discharge date: 02/16/2014  Time spent: >45 minutes  Recommendations for Outpatient Follow-up:  F/u Hgb  Discharge Diagnoses:  Principal Problem:   Acute blood loss anemia Active Problems:   Vaginal bleeding   Endometrial cancer   Anemia, blood loss   Depression with anxiety   Anorexia   Discharge Condition: stable  Diet recommendation: regular diet  Filed Weights   02/15/14 0321  Weight: 69.1 kg (152 lb 5.4 oz)    History of present illness:  Kathy Wagner is a 63 y.o. female presenting on 02/15/2014 AM from [place] with extensive vaginal bleeding and anemia. She has a h/o endometrial CA being treated by Oncology with chemo. Wednesday was her 2nd cycle. She has heavy vaginal bleeding after chemo and required a transfusion after her first chemo last week. She was given a unit of PRBC last night as well at [facility] and was sent to [facility] for radiation of the tumor. She was scheduled for it on Monday but due to the heavy bleeding, her oncologist recommended it be done as soon as possible.    Hospital Course:  Principal Problem:  Acute blood loss anemia  - Hgb now > 8 and bleeding almost resolved- this is confirmed by the fact that her Hgb today is the same as it was yesterday after the transfusion (see below)  Active Problems:  Vaginal bleeding  - slowed down as mentioned above   Endometrial cancer  - initially diagnosed in 2012  - chemo started last wk  - her oncologist wanted radiation to be started ASAP - - she was in severe pain in pelvic region yesterday (on admission) but this has resolved today - has received 2 doses of radiation (yesterday and today)- Dr Sondra Come with Rad Onc will contact Dr Donella Stade the  [place] at [facility] to make him aware that she has had radiation this wknd.   Depression with anxiety  - has  tried 2 doses of Effexor, does not like it due to side effects and would like Prozac which she took many yrs ago  - Prozac started yesterday and she is tolerating it well- will give her a prescription  Anorexia  - on Megace   Procedures:  Radiation x 2  Consultations:  Rad Onc  Discharge Exam: Filed Vitals:   02/16/14 0514  BP: 112/60  Pulse: 71  Temp: 98.6 F (37 C)  Resp: 16    General: AAO x 3 - pleasant Cardiovascular: RRR, no murmurs Respiratory: CTA b/l   Discharge Instructions You were cared for by a hospitalist during your hospital stay. If you have any questions about your discharge medications or the care you received while you were in the hospital after you are discharged, you can call the unit and asked to speak with the hospitalist on call if the hospitalist that took care of you is not available. Once you are discharged, your primary care physician will handle any further medical issues. Please note that NO REFILLS for any discharge medications will be authorized once you are discharged, as it is imperative that you return to your primary care physician (or establish a relationship with a primary care physician if you do not have one) for your aftercare needs so that they can reassess your need for medications and monitor your lab values.  Discharge Instructions   Diet - low sodium heart healthy  Complete by:  As directed      Increase activity slowly    Complete by:  As directed             Medication List    STOP taking these medications       venlafaxine 37.5 MG tablet  Commonly known as:  EFFEXOR      TAKE these medications       dexamethasone 4 MG tablet  Commonly known as:  DECADRON  Take 8 mg by mouth as directed. Take 2 tablets by mouth the day before and the day of chemo.     FLUoxetine 10 MG capsule  Commonly known as:  PROZAC  Take 1 capsule (10 mg total) by mouth daily.     HYDROcodone-acetaminophen 5-325 MG per tablet  Commonly  known as:  NORCO/VICODIN  Take 1 tablet by mouth every 6 (six) hours as needed for moderate pain.     LORazepam 0.5 MG tablet  Commonly known as:  ATIVAN  Take 0.5 mg by mouth every 8 (eight) hours as needed for anxiety.     megestrol 40 MG tablet  Commonly known as:  MEGACE  Take 40 mg by mouth 2 (two) times daily.     omeprazole 20 MG capsule  Commonly known as:  PRILOSEC  Take 20 mg by mouth daily.     PRESCRIPTION MEDICATION  Chemo     promethazine 12.5 MG tablet  Commonly known as:  PHENERGAN  Take 6.25 mg by mouth every 6 (six) hours as needed for nausea or vomiting.     sennosides-docusate sodium 8.6-50 MG tablet  Commonly known as:  SENOKOT-S  Take 1 tablet by mouth at bedtime.     sodium chloride 0.65 % Soln nasal spray  Commonly known as:  OCEAN  Place 1 spray into both nostrils as needed for congestion.       Allergies  Allergen Reactions  . Azithromycin Swelling    Throat swells  . Codeine Nausea And Vomiting  . Gluten Meal Other (See Comments)    Cramping & pain  . Oxycodone Nausea And Vomiting  . Soy Allergy Other (See Comments)    Patient states that she does not want soy because it can increase estrogen and make her bleed  . Tramadol Nausea And Vomiting      The results of significant diagnostics from this hospitalization (including imaging, microbiology, ancillary and laboratory) are listed below for reference.    Significant Diagnostic Studies: No results found.  Microbiology: No results found for this or any previous visit (from the past 240 hour(s)).   Labs: Basic Metabolic Panel:  Recent Labs Lab 02/15/14 0506 02/16/14 0432  NA 138 138  K 3.3* 3.9  CL 101 103  CO2 23 21  GLUCOSE 107* 105*  BUN 9 9  CREATININE 0.64 0.62  CALCIUM 8.7 8.9  MG 1.7 1.8  PHOS  --  3.5   Liver Function Tests:  Recent Labs Lab 02/15/14 0506 02/16/14 0432  AST 17 18  ALT 16 18  ALKPHOS 55 53  BILITOT 0.8 0.5  PROT 6.4 6.2  ALBUMIN 3.0*  2.8*   No results found for this basename: LIPASE, AMYLASE,  in the last 168 hours No results found for this basename: AMMONIA,  in the last 168 hours CBC:  Recent Labs Lab 02/15/14 0506 02/16/14 0432  WBC 6.3 5.0  HGB 8.5* 8.5*  HCT 25.8* 25.5*  MCV 77.5* 76.8*  PLT 337 323  Cardiac Enzymes: No results found for this basename: CKTOTAL, CKMB, CKMBINDEX, TROPONINI,  in the last 168 hours BNP: BNP (last 3 results) No results found for this basename: PROBNP,  in the last 8760 hours CBG: No results found for this basename: GLUCAP,  in the last 168 hours     Signed:  Debbe Odea, MD [facility] 02/16/2014, 10:26 AM

## 2014-02-16 NOTE — Progress Notes (Signed)
  Radiation Oncology         [phone]) [phone] ________________________________  Name: Kathy Wagner MRN: [id number]  Date: 02/16/2014  DOB: February 19, 1951  Weekly Radiation Therapy Management - Inpatient  Diagnosis: Endometrial cancer  Current Dose: 6 Gy     Planned Dose:  6 Gy  Narrative . . . . . . . Marland Kitchen the patient completed her second radiation treatment directed at the pelvis area. She has tolerated her radiation therapy well thus far. The patient's vaginal bleeding has decreased significantly per nursing notes over the past 24 hours. Hemoglobin has remained stable over the past 24 hours.                                   The patient is without complaint. No nausea with her first treatment.                                 Set-up films were reviewed.                                 The chart was checked. . Impression . . . . . . . The patient is tolerating radiation. Plan . . . . . . . . . . . Marland Kitchen the patient may be discharged later today, pending review by the patient's hospitalist. She will continue with radiation therapy as an outpatient tomorrow at the [facility].  I will contact Dr. Noreene Filbert in the  morning  to let him know of her urgent radiation therapy over the weekend. He may need to adjust his total planned radiation dose.  ________________________________   Blair Promise, PhD, MD

## 2014-02-16 NOTE — Therapy (Signed)
[facility] Therapy Treatment Record Phone [phone]   Radiation Therapy was administered to Kathy Wagner on: 02/16/2014  7:19 AM and was treatment # 2 out of a planned course of 2 treatments.

## 2014-02-16 NOTE — Progress Notes (Signed)
Vaginal bleeding has decreased substantially.  Only one peripad pad used in past 13 hours.  And urine is only slightly pink.  No clots noted this shift.

## 2014-02-19 LAB — CBC CANCER CENTER
Basophil #: 0 x10 3/mm (ref 0.0–0.1)
Basophil %: 0.3 %
Eosinophil #: 0 x10 3/mm (ref 0.0–0.7)
Eosinophil %: 0.1 %
HCT: 26.2 % — ABNORMAL LOW (ref 35.0–47.0)
HGB: 8.7 g/dL — ABNORMAL LOW (ref 12.0–16.0)
LYMPHS ABS: 0.2 x10 3/mm — AB (ref 1.0–3.6)
LYMPHS PCT: 5.5 %
MCH: 26.1 pg (ref 26.0–34.0)
MCHC: 33.2 g/dL (ref 32.0–36.0)
MCV: 79 fL — ABNORMAL LOW (ref 80–100)
MONO ABS: 0.2 x10 3/mm (ref 0.2–0.9)
MONOS PCT: 5.8 %
NEUTROS ABS: 2.7 x10 3/mm (ref 1.4–6.5)
Neutrophil %: 88.3 %
PLATELETS: 305 x10 3/mm (ref 150–440)
RBC: 3.33 10*6/uL — ABNORMAL LOW (ref 3.80–5.20)
RDW: 23.7 % — AB (ref 11.5–14.5)
WBC: 3 x10 3/mm — ABNORMAL LOW (ref 3.6–11.0)

## 2014-02-19 LAB — CREATININE, SERUM
CREATININE: 0.77 mg/dL (ref 0.60–1.30)
EGFR (African American): >60
EGFR (Non-African Amer.): >60

## 2014-02-26 LAB — CBC CANCER CENTER
Basophil #: 0 x10 3/mm (ref 0.0–0.1)
Basophil %: 0.4 %
Eosinophil #: 0.1 x10 3/mm (ref 0.0–0.7)
Eosinophil %: 3.9 %
HCT: 24.7 % — ABNORMAL LOW (ref 35.0–47.0)
HGB: 8.3 g/dL — AB (ref 12.0–16.0)
LYMPHS ABS: 0.3 x10 3/mm — AB (ref 1.0–3.6)
Lymphocyte %: 17.2 %
MCH: 26.4 pg (ref 26.0–34.0)
MCHC: 33.6 g/dL (ref 32.0–36.0)
MCV: 79 fL — AB (ref 80–100)
MONOS PCT: 12.9 %
Monocyte #: 0.2 x10 3/mm (ref 0.2–0.9)
NEUTROS ABS: 1.2 x10 3/mm — AB (ref 1.4–6.5)
Neutrophil %: 65.6 %
Platelet: 135 x10 3/mm — ABNORMAL LOW (ref 150–440)
RBC: 3.14 10*6/uL — ABNORMAL LOW (ref 3.80–5.20)
RDW: 23.4 % — AB (ref 11.5–14.5)
WBC: 1.9 x10 3/mm — AB (ref 3.6–11.0)

## 2014-03-05 ENCOUNTER — Ambulatory Visit: Payer: Self-pay | Admitting: Internal Medicine

## 2014-03-05 ENCOUNTER — Encounter: Payer: Self-pay | Admitting: Radiation Oncology

## 2014-03-05 LAB — CBC CANCER CENTER
BASOS ABS: 0 x10 3/mm (ref 0.0–0.1)
BASOS PCT: 0.3 %
EOS ABS: 0 x10 3/mm (ref 0.0–0.7)
Eosinophil %: 0.1 %
HCT: 27.7 % — ABNORMAL LOW (ref 35.0–47.0)
HGB: 9 g/dL — AB (ref 12.0–16.0)
Lymphocyte #: 0.2 x10 3/mm — ABNORMAL LOW (ref 1.0–3.6)
Lymphocyte %: 12.2 %
MCH: 26.5 pg (ref 26.0–34.0)
MCHC: 32.3 g/dL (ref 32.0–36.0)
MCV: 82 fL (ref 80–100)
MONO ABS: 0.1 x10 3/mm — AB (ref 0.2–0.9)
Monocyte %: 4.6 %
NEUTROS PCT: 82.8 %
Neutrophil #: 1.4 x10 3/mm (ref 1.4–6.5)
Platelet: 92 x10 3/mm — ABNORMAL LOW (ref 150–440)
RBC: 3.38 10*6/uL — ABNORMAL LOW (ref 3.80–5.20)
RDW: 25.7 % — AB (ref 11.5–14.5)
WBC: 1.8 x10 3/mm — CL (ref 3.6–11.0)

## 2014-03-05 LAB — HEPATIC FUNCTION PANEL A (ARMC)
ALK PHOS: 68 U/L
ALT: 19 U/L (ref 12–78)
AST: 14 U/L — AB (ref 15–37)
Albumin: 2.9 g/dL — ABNORMAL LOW (ref 3.4–5.0)
BILIRUBIN TOTAL: 0.2 mg/dL (ref 0.2–1.0)
Bilirubin, Direct: <0.1 mg/dL (ref 0.00–0.20)
TOTAL PROTEIN: 6.9 g/dL (ref 6.4–8.2)

## 2014-03-05 LAB — CREATININE, SERUM
CREATININE: 0.78 mg/dL (ref 0.60–1.30)
EGFR (Non-African Amer.): >60

## 2014-03-05 NOTE — Progress Notes (Signed)
  Radiation Oncology         (336) [phone] ________________________________  Name: Kathy Wagner MRN: [id number]  Date: 03/05/2014  DOB: 1950-10-14  End of Treatment Note  Diagnosis:   Locally advanced endometrial cancer  Indication for treatment:  Severe vaginal bleeding       Radiation treatment dates:   June 13 and June 14, as an inpatient  Site/dose:   Pelvis,  6 gray in 2 fractions   Beams/energy:   AP PA, 15 MV photons  Narrative: The patient tolerated radiation treatment relatively well.   Her bleeding slowed during the course of her hospitalization  Plan: The patient will continue outpatient radiation therapy at the [facility] on June 15. -----------------------------------  Blair Promise, PhD, MD

## 2014-03-11 ENCOUNTER — Telehealth: Payer: Self-pay | Admitting: Internal Medicine

## 2014-03-11 LAB — CBC CANCER CENTER
BASOS PCT: 1.2 %
Basophil #: 0 x10 3/mm (ref 0.0–0.1)
Eosinophil #: 0.1 x10 3/mm (ref 0.0–0.7)
Eosinophil %: 17.1 %
HCT: 26.1 % — ABNORMAL LOW (ref 35.0–47.0)
HGB: 8.4 g/dL — AB (ref 12.0–16.0)
Lymphocyte #: 0.2 x10 3/mm — ABNORMAL LOW (ref 1.0–3.6)
Lymphocyte %: 24.5 %
MCH: 26.6 pg (ref 26.0–34.0)
MCHC: 32.2 g/dL (ref 32.0–36.0)
MCV: 83 fL (ref 80–100)
MONO ABS: 0.1 x10 3/mm — AB (ref 0.2–0.9)
MONOS PCT: 8.9 %
Neutrophil #: 0.3 x10 3/mm — ABNORMAL LOW (ref 1.4–6.5)
Neutrophil %: 48.3 %
Platelet: 157 x10 3/mm (ref 150–440)
RBC: 3.16 10*6/uL — ABNORMAL LOW (ref 3.80–5.20)
RDW: 25.6 % — ABNORMAL HIGH (ref 11.5–14.5)
WBC: 0.7 x10 3/mm — CL (ref 3.6–11.0)

## 2014-03-11 LAB — CREATININE, SERUM
Creatinine: 0.77 mg/dL (ref 0.60–1.30)
EGFR (African American): >60
EGFR (Non-African Amer.): >60

## 2014-03-18 LAB — CBC CANCER CENTER
BASOS ABS: 0 x10 3/mm (ref 0.0–0.1)
Basophil %: 0.7 %
EOS PCT: 0.9 %
Eosinophil #: 0.1 x10 3/mm (ref 0.0–0.7)
HCT: 24.2 % — ABNORMAL LOW (ref 35.0–47.0)
HGB: 8.1 g/dL — ABNORMAL LOW (ref 12.0–16.0)
Lymphocyte #: 0.4 x10 3/mm — ABNORMAL LOW (ref 1.0–3.6)
Lymphocyte %: 5.6 %
MCH: 27.4 pg (ref 26.0–34.0)
MCHC: 33.4 g/dL (ref 32.0–36.0)
MCV: 82 fL (ref 80–100)
Monocyte #: 0.8 x10 3/mm (ref 0.2–0.9)
Monocyte %: 13.1 %
Neutrophil #: 5 x10 3/mm (ref 1.4–6.5)
Neutrophil %: 79.7 %
Platelet: 545 x10 3/mm — ABNORMAL HIGH (ref 150–440)
RBC: 2.94 10*6/uL — ABNORMAL LOW (ref 3.80–5.20)
RDW: 25.4 % — ABNORMAL HIGH (ref 11.5–14.5)
WBC: 6.3 x10 3/mm (ref 3.6–11.0)

## 2014-03-18 LAB — CREATININE, SERUM
CREATININE: 0.79 mg/dL (ref 0.60–1.30)
EGFR (African American): >60
EGFR (Non-African Amer.): >60

## 2014-03-25 LAB — CBC CANCER CENTER
BASOS ABS: 0 x10 3/mm (ref 0.0–0.1)
BASOS PCT: 0.7 %
EOS PCT: 1.2 %
Eosinophil #: 0 x10 3/mm (ref 0.0–0.7)
HCT: 24.1 % — AB (ref 35.0–47.0)
HGB: 8 g/dL — AB (ref 12.0–16.0)
Lymphocyte #: 0.2 x10 3/mm — ABNORMAL LOW (ref 1.0–3.6)
Lymphocyte %: 8.2 %
MCH: 27 pg (ref 26.0–34.0)
MCHC: 33 g/dL (ref 32.0–36.0)
MCV: 82 fL (ref 80–100)
Monocyte #: 0.2 x10 3/mm (ref 0.2–0.9)
Monocyte %: 8.7 %
NEUTROS ABS: 2 x10 3/mm (ref 1.4–6.5)
Neutrophil %: 81.2 %
PLATELETS: 374 x10 3/mm (ref 150–440)
RBC: 2.95 10*6/uL — AB (ref 3.80–5.20)
RDW: 25.3 % — AB (ref 11.5–14.5)
WBC: 2.5 x10 3/mm — ABNORMAL LOW (ref 3.6–11.0)

## 2014-03-25 LAB — FERRITIN: FERRITIN (ARMC): 509 ng/mL — AB (ref 8–388)

## 2014-03-25 LAB — CREATININE, SERUM
Creatinine: 0.68 mg/dL (ref 0.60–1.30)
EGFR (African American): >60
EGFR (Non-African Amer.): >60

## 2014-03-25 LAB — IRON AND TIBC
IRON BIND. CAP.(TOTAL): 287 ug/dL (ref 250–450)
IRON: 52 ug/dL (ref 50–170)
Iron Saturation: 18 %
Unbound Iron-Bind.Cap.: 235 ug/dL

## 2014-04-01 LAB — BASIC METABOLIC PANEL
Anion Gap: 10 (ref 7–16)
BUN: 9 mg/dL (ref 7–18)
CREATININE: 0.68 mg/dL (ref 0.60–1.30)
Calcium, Total: 8.3 mg/dL — ABNORMAL LOW (ref 8.5–10.1)
Chloride: 104 mmol/L (ref 98–107)
Co2: 25 mmol/L (ref 21–32)
EGFR (African American): >60
EGFR (Non-African Amer.): >60
Glucose: 90 mg/dL (ref 65–99)
Osmolality: 276 (ref 275–301)
POTASSIUM: 3.8 mmol/L (ref 3.5–5.1)
Sodium: 139 mmol/L (ref 136–145)

## 2014-04-01 LAB — CBC CANCER CENTER
BASOS ABS: 0 x10 3/mm (ref 0.0–0.1)
BASOS PCT: 1.1 %
Eosinophil #: 0.1 x10 3/mm (ref 0.0–0.7)
Eosinophil %: 4 %
HCT: 22.3 % — ABNORMAL LOW (ref 35.0–47.0)
HGB: 7.4 g/dL — ABNORMAL LOW (ref 12.0–16.0)
Lymphocyte #: 0.3 x10 3/mm — ABNORMAL LOW (ref 1.0–3.6)
Lymphocyte %: 9 %
MCH: 28.6 pg (ref 26.0–34.0)
MCHC: 33.4 g/dL (ref 32.0–36.0)
MCV: 86 fL (ref 80–100)
MONO ABS: 0.6 x10 3/mm (ref 0.2–0.9)
Monocyte %: 17.2 %
NEUTROS PCT: 68.7 %
Neutrophil #: 2.2 x10 3/mm (ref 1.4–6.5)
PLATELETS: 195 x10 3/mm (ref 150–440)
RBC: 2.6 10*6/uL — ABNORMAL LOW (ref 3.80–5.20)
RDW: 28.1 % — ABNORMAL HIGH (ref 11.5–14.5)
WBC: 3.2 x10 3/mm — ABNORMAL LOW (ref 3.6–11.0)

## 2014-04-01 LAB — HEPATIC FUNCTION PANEL A (ARMC)
ALK PHOS: 74 U/L
ALT: 16 U/L
AST: 14 U/L — AB (ref 15–37)
Albumin: 2.9 g/dL — ABNORMAL LOW (ref 3.4–5.0)
Bilirubin, Direct: <0.1 mg/dL (ref 0.00–0.20)
Bilirubin,Total: 0.2 mg/dL (ref 0.2–1.0)
TOTAL PROTEIN: 6.7 g/dL (ref 6.4–8.2)

## 2014-04-02 LAB — CA 125: CA 125: 35.5 U/mL — AB (ref 0.0–34.0)

## 2014-04-05 ENCOUNTER — Ambulatory Visit: Payer: Self-pay | Admitting: Internal Medicine

## 2014-04-08 LAB — CBC CANCER CENTER
BASOS PCT: 0.6 %
Basophil #: 0 x10 3/mm (ref 0.0–0.1)
EOS ABS: 0.2 x10 3/mm (ref 0.0–0.7)
EOS PCT: 4 %
HCT: 25 % — ABNORMAL LOW (ref 35.0–47.0)
HGB: 8.1 g/dL — ABNORMAL LOW (ref 12.0–16.0)
LYMPHS PCT: 6.3 %
Lymphocyte #: 0.3 x10 3/mm — ABNORMAL LOW (ref 1.0–3.6)
MCH: 28.2 pg (ref 26.0–34.0)
MCHC: 32.5 g/dL (ref 32.0–36.0)
MCV: 87 fL (ref 80–100)
MONO ABS: 0.5 x10 3/mm (ref 0.2–0.9)
MONOS PCT: 9.8 %
Neutrophil #: 3.8 x10 3/mm (ref 1.4–6.5)
Neutrophil %: 79.3 %
PLATELETS: 137 x10 3/mm — AB (ref 150–440)
RBC: 2.88 10*6/uL — AB (ref 3.80–5.20)
RDW: 25.3 % — ABNORMAL HIGH (ref 11.5–14.5)
WBC: 4.8 x10 3/mm (ref 3.6–11.0)

## 2014-04-08 LAB — CREATININE, SERUM
Creatinine: 0.7 mg/dL (ref 0.60–1.30)
EGFR (Non-African Amer.): >60

## 2014-04-15 LAB — CBC CANCER CENTER
BASOS ABS: 0 x10 3/mm (ref 0.0–0.1)
BASOS PCT: 1.3 %
EOS PCT: 5.4 %
Eosinophil #: 0.1 x10 3/mm (ref 0.0–0.7)
HCT: 24 % — AB (ref 35.0–47.0)
HGB: 7.8 g/dL — ABNORMAL LOW (ref 12.0–16.0)
LYMPHS PCT: 9.7 %
Lymphocyte #: 0.3 x10 3/mm — ABNORMAL LOW (ref 1.0–3.6)
MCH: 28.4 pg (ref 26.0–34.0)
MCHC: 32.5 g/dL (ref 32.0–36.0)
MCV: 88 fL (ref 80–100)
Monocyte #: 0.1 x10 3/mm — ABNORMAL LOW (ref 0.2–0.9)
Monocyte %: 2.5 %
NEUTROS ABS: 2.2 x10 3/mm (ref 1.4–6.5)
NEUTROS PCT: 81.1 %
PLATELETS: 60 x10 3/mm — AB (ref 150–440)
RBC: 2.74 10*6/uL — AB (ref 3.80–5.20)
RDW: 24.5 % — ABNORMAL HIGH (ref 11.5–14.5)
WBC: 2.7 x10 3/mm — AB (ref 3.6–11.0)

## 2014-04-15 LAB — CREATININE, SERUM
Creatinine: 0.8 mg/dL (ref 0.60–1.30)
EGFR (African American): >60
EGFR (Non-African Amer.): >60

## 2014-04-20 NOTE — Telephone Encounter (Signed)
A user error has taken place: encounter opened in error, closed for administrative reasons.

## 2014-04-21 LAB — COMPREHENSIVE METABOLIC PANEL
ALK PHOS: 62 U/L
AST: 15 U/L (ref 15–37)
Albumin: 2.9 g/dL — ABNORMAL LOW (ref 3.4–5.0)
Anion Gap: 12 (ref 7–16)
BILIRUBIN TOTAL: 0.2 mg/dL (ref 0.2–1.0)
BUN: 16 mg/dL (ref 7–18)
CALCIUM: 7.5 mg/dL — AB (ref 8.5–10.1)
Chloride: 103 mmol/L (ref 98–107)
Co2: 23 mmol/L (ref 21–32)
Creatinine: 0.84 mg/dL (ref 0.60–1.30)
GLUCOSE: 88 mg/dL (ref 65–99)
Osmolality: 276 (ref 275–301)
Potassium: 4.4 mmol/L (ref 3.5–5.1)
SGPT (ALT): 18 U/L
Sodium: 138 mmol/L (ref 136–145)
Total Protein: 6.7 g/dL (ref 6.4–8.2)

## 2014-04-21 LAB — CBC CANCER CENTER
BASOS ABS: 0 x10 3/mm (ref 0.0–0.1)
BASOS PCT: 0.5 %
EOS ABS: 0.1 x10 3/mm (ref 0.0–0.7)
Eosinophil %: 4.2 %
HCT: 21.9 % — ABNORMAL LOW (ref 35.0–47.0)
HGB: 7.2 g/dL — AB (ref 12.0–16.0)
LYMPHS ABS: 0.2 x10 3/mm — AB (ref 1.0–3.6)
Lymphocyte %: 15.6 %
MCH: 29.2 pg (ref 26.0–34.0)
MCHC: 32.9 g/dL (ref 32.0–36.0)
MCV: 89 fL (ref 80–100)
MONOS PCT: 9.1 %
Monocyte #: 0.1 x10 3/mm — ABNORMAL LOW (ref 0.2–0.9)
Neutrophil #: 1 x10 3/mm — ABNORMAL LOW (ref 1.4–6.5)
Neutrophil %: 70.6 %
Platelet: 45 x10 3/mm — ABNORMAL LOW (ref 150–440)
RBC: 2.47 10*6/uL — AB (ref 3.80–5.20)
RDW: 25.5 % — AB (ref 11.5–14.5)
WBC: 1.5 x10 3/mm — CL (ref 3.6–11.0)

## 2014-04-29 LAB — BASIC METABOLIC PANEL
Anion Gap: 10 (ref 7–16)
BUN: 18 mg/dL (ref 7–18)
CHLORIDE: 102 mmol/L (ref 98–107)
CO2: 25 mmol/L (ref 21–32)
Calcium, Total: 7.9 mg/dL — ABNORMAL LOW (ref 8.5–10.1)
Creatinine: 0.85 mg/dL (ref 0.60–1.30)
Glucose: 89 mg/dL (ref 65–99)
OSMOLALITY: 275 (ref 275–301)
POTASSIUM: 4.1 mmol/L (ref 3.5–5.1)
Sodium: 137 mmol/L (ref 136–145)

## 2014-04-29 LAB — CBC CANCER CENTER
BASOS PCT: 0.6 %
Basophil #: 0 x10 3/mm (ref 0.0–0.1)
EOS ABS: 0 x10 3/mm (ref 0.0–0.7)
EOS PCT: 2.4 %
HCT: 21.6 % — AB (ref 35.0–47.0)
HGB: 7.1 g/dL — ABNORMAL LOW (ref 12.0–16.0)
LYMPHS ABS: 0.2 x10 3/mm — AB (ref 1.0–3.6)
LYMPHS PCT: 17.8 %
MCH: 30.7 pg (ref 26.0–34.0)
MCHC: 32.9 g/dL (ref 32.0–36.0)
MCV: 93 fL (ref 80–100)
MONO ABS: 0.3 x10 3/mm (ref 0.2–0.9)
MONOS PCT: 21.4 %
NEUTROS PCT: 57.8 %
Neutrophil #: 0.8 x10 3/mm — ABNORMAL LOW (ref 1.4–6.5)
Platelet: 106 x10 3/mm — ABNORMAL LOW (ref 150–440)
RBC: 2.31 10*6/uL — AB (ref 3.80–5.20)
RDW: 29.2 % — ABNORMAL HIGH (ref 11.5–14.5)
WBC: 1.4 x10 3/mm — CL (ref 3.6–11.0)

## 2014-05-06 ENCOUNTER — Ambulatory Visit: Payer: Self-pay | Admitting: Internal Medicine

## 2014-05-06 LAB — CBC CANCER CENTER
Basophil #: 0 x10 3/mm (ref 0.0–0.1)
Basophil %: 0.8 %
EOS PCT: 1.6 %
Eosinophil #: 0 x10 3/mm (ref 0.0–0.7)
HCT: 23.2 % — ABNORMAL LOW (ref 35.0–47.0)
HGB: 7.5 g/dL — AB (ref 12.0–16.0)
LYMPHS PCT: 11 %
Lymphocyte #: 0.2 x10 3/mm — ABNORMAL LOW (ref 1.0–3.6)
MCH: 31.4 pg (ref 26.0–34.0)
MCHC: 32.5 g/dL (ref 32.0–36.0)
MCV: 97 fL (ref 80–100)
MONO ABS: 0.4 x10 3/mm (ref 0.2–0.9)
MONOS PCT: 17.8 %
NEUTROS PCT: 68.8 %
Neutrophil #: 1.5 x10 3/mm (ref 1.4–6.5)
Platelet: 209 x10 3/mm (ref 150–440)
RBC: 2.4 10*6/uL — ABNORMAL LOW (ref 3.80–5.20)
RDW: 30.5 % — AB (ref 11.5–14.5)
WBC: 2.2 x10 3/mm — ABNORMAL LOW (ref 3.6–11.0)

## 2014-05-06 LAB — BASIC METABOLIC PANEL
Anion Gap: 7 (ref 7–16)
BUN: 14 mg/dL (ref 7–18)
Calcium, Total: 8.1 mg/dL — ABNORMAL LOW (ref 8.5–10.1)
Chloride: 99 mmol/L (ref 98–107)
Co2: 29 mmol/L (ref 21–32)
Creatinine: 0.94 mg/dL (ref 0.60–1.30)
EGFR (Non-African Amer.): >60
Glucose: 90 mg/dL (ref 65–99)
OSMOLALITY: 270 (ref 275–301)
Potassium: 4.3 mmol/L (ref 3.5–5.1)
SODIUM: 135 mmol/L — AB (ref 136–145)

## 2014-05-20 LAB — CBC CANCER CENTER
Basophil #: 0 x10 3/mm (ref 0.0–0.1)
Basophil %: 0.7 %
EOS ABS: 0.1 x10 3/mm (ref 0.0–0.7)
Eosinophil %: 3.6 %
HCT: 26.8 % — AB (ref 35.0–47.0)
HGB: 8.8 g/dL — ABNORMAL LOW (ref 12.0–16.0)
LYMPHS ABS: 0.3 x10 3/mm — AB (ref 1.0–3.6)
Lymphocyte %: 8.3 %
MCH: 32.7 pg (ref 26.0–34.0)
MCHC: 32.8 g/dL (ref 32.0–36.0)
MCV: 100 fL (ref 80–100)
Monocyte #: 0.6 x10 3/mm (ref 0.2–0.9)
Monocyte %: 16.6 %
NEUTROS ABS: 2.4 x10 3/mm (ref 1.4–6.5)
Neutrophil %: 70.8 %
PLATELETS: 287 x10 3/mm (ref 150–440)
RBC: 2.68 10*6/uL — ABNORMAL LOW (ref 3.80–5.20)
RDW: 24.7 % — ABNORMAL HIGH (ref 11.5–14.5)
WBC: 3.4 x10 3/mm — ABNORMAL LOW (ref 3.6–11.0)

## 2014-05-20 LAB — COMPREHENSIVE METABOLIC PANEL
ALT: 17 U/L
AST: 18 U/L (ref 15–37)
Albumin: 3.6 g/dL (ref 3.4–5.0)
Alkaline Phosphatase: 71 U/L
Anion Gap: 7 (ref 7–16)
BILIRUBIN TOTAL: 0.4 mg/dL (ref 0.2–1.0)
BUN: 18 mg/dL (ref 7–18)
CALCIUM: 9.2 mg/dL (ref 8.5–10.1)
CHLORIDE: 99 mmol/L (ref 98–107)
CO2: 29 mmol/L (ref 21–32)
Creatinine: 0.94 mg/dL (ref 0.60–1.30)
EGFR (African American): >60
Glucose: 98 mg/dL (ref 65–99)
OSMOLALITY: 272 (ref 275–301)
POTASSIUM: 4.4 mmol/L (ref 3.5–5.1)
Sodium: 135 mmol/L — ABNORMAL LOW (ref 136–145)
Total Protein: 7.6 g/dL (ref 6.4–8.2)

## 2014-05-28 LAB — BASIC METABOLIC PANEL
ANION GAP: 8 (ref 7–16)
BUN: 17 mg/dL (ref 7–18)
CALCIUM: 8.9 mg/dL (ref 8.5–10.1)
CHLORIDE: 95 mmol/L — AB (ref 98–107)
Co2: 27 mmol/L (ref 21–32)
Creatinine: 0.79 mg/dL (ref 0.60–1.30)
GLUCOSE: 102 mg/dL — AB (ref 65–99)
Osmolality: 263 (ref 275–301)
POTASSIUM: 4 mmol/L (ref 3.5–5.1)
Sodium: 130 mmol/L — ABNORMAL LOW (ref 136–145)

## 2014-05-28 LAB — CBC CANCER CENTER
BASOS ABS: 0 x10 3/mm (ref 0.0–0.1)
Basophil %: 0.8 %
EOS PCT: 6.3 %
Eosinophil #: 0.2 x10 3/mm (ref 0.0–0.7)
HCT: 25.6 % — ABNORMAL LOW (ref 35.0–47.0)
HGB: 8.5 g/dL — AB (ref 12.0–16.0)
LYMPHS ABS: 0.3 x10 3/mm — AB (ref 1.0–3.6)
LYMPHS PCT: 8.2 %
MCH: 33.2 pg (ref 26.0–34.0)
MCHC: 33.1 g/dL (ref 32.0–36.0)
MCV: 100 fL (ref 80–100)
Monocyte #: 0.3 x10 3/mm (ref 0.2–0.9)
Monocyte %: 7.3 %
NEUTROS ABS: 2.9 x10 3/mm (ref 1.4–6.5)
NEUTROS PCT: 77.4 %
Platelet: 247 x10 3/mm (ref 150–440)
RBC: 2.55 10*6/uL — ABNORMAL LOW (ref 3.80–5.20)
RDW: 22.5 % — ABNORMAL HIGH (ref 11.5–14.5)
WBC: 3.8 x10 3/mm (ref 3.6–11.0)

## 2014-06-04 LAB — CBC CANCER CENTER
BASOS PCT: 0.8 %
Basophil #: 0 x10 3/mm (ref 0.0–0.1)
EOS ABS: 0.1 x10 3/mm (ref 0.0–0.7)
Eosinophil %: 2.7 %
HCT: 25.4 % — AB (ref 35.0–47.0)
HGB: 8 g/dL — AB (ref 12.0–16.0)
LYMPHS ABS: 0.2 x10 3/mm — AB (ref 1.0–3.6)
Lymphocyte %: 11 %
MCH: 32.2 pg (ref 26.0–34.0)
MCHC: 31.6 g/dL — ABNORMAL LOW (ref 32.0–36.0)
MCV: 102 fL — ABNORMAL HIGH (ref 80–100)
MONO ABS: 0.2 x10 3/mm (ref 0.2–0.9)
Monocyte %: 9.1 %
NEUTROS ABS: 1.6 x10 3/mm (ref 1.4–6.5)
Neutrophil %: 76.4 %
PLATELETS: 192 x10 3/mm (ref 150–440)
RBC: 2.49 10*6/uL — ABNORMAL LOW (ref 3.80–5.20)
RDW: 20.9 % — ABNORMAL HIGH (ref 11.5–14.5)
WBC: 2.1 x10 3/mm — ABNORMAL LOW (ref 3.6–11.0)

## 2014-06-04 LAB — BASIC METABOLIC PANEL
Anion Gap: 10 (ref 7–16)
BUN: 18 mg/dL (ref 7–18)
Calcium, Total: 8.8 mg/dL (ref 8.5–10.1)
Chloride: 98 mmol/L (ref 98–107)
Co2: 26 mmol/L (ref 21–32)
Creatinine: 0.77 mg/dL (ref 0.60–1.30)
EGFR (African American): >60
EGFR (Non-African Amer.): >60
Glucose: 100 mg/dL — ABNORMAL HIGH (ref 65–99)
Osmolality: 270 (ref 275–301)
Potassium: 4 mmol/L (ref 3.5–5.1)
Sodium: 134 mmol/L — ABNORMAL LOW (ref 136–145)

## 2014-06-05 ENCOUNTER — Ambulatory Visit: Payer: Self-pay | Admitting: Internal Medicine

## 2014-06-18 LAB — BASIC METABOLIC PANEL
Anion Gap: 8 (ref 7–16)
BUN: 16 mg/dL (ref 7–18)
CALCIUM: 8.7 mg/dL (ref 8.5–10.1)
Chloride: 101 mmol/L (ref 98–107)
Co2: 28 mmol/L (ref 21–32)
Creatinine: 0.81 mg/dL (ref 0.60–1.30)
EGFR (African American): >60
EGFR (Non-African Amer.): >60
GLUCOSE: 96 mg/dL (ref 65–99)
Osmolality: 275 (ref 275–301)
POTASSIUM: 3.8 mmol/L (ref 3.5–5.1)
SODIUM: 137 mmol/L (ref 136–145)

## 2014-06-18 LAB — CBC CANCER CENTER
BASOS PCT: 1 %
Basophil #: 0 x10 3/mm (ref 0.0–0.1)
Eosinophil #: 0.1 x10 3/mm (ref 0.0–0.7)
Eosinophil %: 2.1 %
HCT: 24.1 % — ABNORMAL LOW (ref 35.0–47.0)
HGB: 7.9 g/dL — AB (ref 12.0–16.0)
LYMPHS ABS: 0.3 x10 3/mm — AB (ref 1.0–3.6)
Lymphocyte %: 11.3 %
MCH: 33.4 pg (ref 26.0–34.0)
MCHC: 32.9 g/dL (ref 32.0–36.0)
MCV: 102 fL — ABNORMAL HIGH (ref 80–100)
MONOS PCT: 10.2 %
Monocyte #: 0.3 x10 3/mm (ref 0.2–0.9)
Neutrophil #: 1.9 x10 3/mm (ref 1.4–6.5)
Neutrophil %: 75.4 %
PLATELETS: 67 x10 3/mm — AB (ref 150–440)
RBC: 2.37 10*6/uL — AB (ref 3.80–5.20)
RDW: 19.2 % — AB (ref 11.5–14.5)
WBC: 2.6 x10 3/mm — ABNORMAL LOW (ref 3.6–11.0)

## 2014-06-18 LAB — HEPATIC FUNCTION PANEL A (ARMC)
Albumin: 3.4 g/dL (ref 3.4–5.0)
Alkaline Phosphatase: 64 U/L
BILIRUBIN DIRECT: 0.1 mg/dL (ref 0.00–0.20)
Bilirubin,Total: 0.3 mg/dL (ref 0.2–1.0)
SGOT(AST): 16 U/L (ref 15–37)
SGPT (ALT): 19 U/L
Total Protein: 7.3 g/dL (ref 6.4–8.2)

## 2014-06-24 LAB — CBC CANCER CENTER
BASOS ABS: 0 x10 3/mm (ref 0.0–0.1)
Basophil %: 0.6 %
Eosinophil #: 0.1 x10 3/mm (ref 0.0–0.7)
Eosinophil %: 3.2 %
HCT: 23.7 % — AB (ref 35.0–47.0)
HGB: 7.6 g/dL — ABNORMAL LOW (ref 12.0–16.0)
Lymphocyte #: 0.3 x10 3/mm — ABNORMAL LOW (ref 1.0–3.6)
Lymphocyte %: 11.2 %
MCH: 32.7 pg (ref 26.0–34.0)
MCHC: 32.1 g/dL (ref 32.0–36.0)
MCV: 102 fL — ABNORMAL HIGH (ref 80–100)
MONOS PCT: 6.8 %
Monocyte #: 0.2 x10 3/mm (ref 0.2–0.9)
Neutrophil #: 2 x10 3/mm (ref 1.4–6.5)
Neutrophil %: 78.2 %
PLATELETS: 34 x10 3/mm — AB (ref 150–440)
RBC: 2.32 10*6/uL — ABNORMAL LOW (ref 3.80–5.20)
RDW: 19.1 % — ABNORMAL HIGH (ref 11.5–14.5)
WBC: 2.6 x10 3/mm — AB (ref 3.6–11.0)

## 2014-06-24 LAB — COMPREHENSIVE METABOLIC PANEL
AST: 20 U/L (ref 15–37)
Albumin: 3.5 g/dL (ref 3.4–5.0)
Alkaline Phosphatase: 63 U/L
Anion Gap: 8 (ref 7–16)
BILIRUBIN TOTAL: 0.4 mg/dL (ref 0.2–1.0)
BUN: 14 mg/dL (ref 7–18)
CALCIUM: 8.6 mg/dL (ref 8.5–10.1)
CO2: 28 mmol/L (ref 21–32)
Chloride: 101 mmol/L (ref 98–107)
Creatinine: 0.9 mg/dL (ref 0.60–1.30)
EGFR (African American): >60
Glucose: 94 mg/dL (ref 65–99)
Osmolality: 274 (ref 275–301)
Potassium: 3.8 mmol/L (ref 3.5–5.1)
SGPT (ALT): 22 U/L
Sodium: 137 mmol/L (ref 136–145)
TOTAL PROTEIN: 7.2 g/dL (ref 6.4–8.2)

## 2014-07-01 LAB — BASIC METABOLIC PANEL
ANION GAP: 7 (ref 7–16)
BUN: 20 mg/dL — AB (ref 7–18)
CALCIUM: 8.7 mg/dL (ref 8.5–10.1)
CHLORIDE: 101 mmol/L (ref 98–107)
Co2: 29 mmol/L (ref 21–32)
Creatinine: 0.89 mg/dL (ref 0.60–1.30)
EGFR (African American): >60
EGFR (Non-African Amer.): >60
GLUCOSE: 98 mg/dL (ref 65–99)
OSMOLALITY: 276 (ref 275–301)
POTASSIUM: 3.8 mmol/L (ref 3.5–5.1)
SODIUM: 137 mmol/L (ref 136–145)

## 2014-07-01 LAB — CBC CANCER CENTER
Basophil #: 0 x10 3/mm (ref 0.0–0.1)
Basophil %: 0.3 %
Eosinophil #: 0.1 x10 3/mm (ref 0.0–0.7)
Eosinophil %: 6.7 %
HCT: 21.2 % — AB (ref 35.0–47.0)
HGB: 6.9 g/dL — AB (ref 12.0–16.0)
LYMPHS ABS: 0.2 x10 3/mm — AB (ref 1.0–3.6)
LYMPHS PCT: 17.1 %
MCH: 33.6 pg (ref 26.0–34.0)
MCHC: 32.7 g/dL (ref 32.0–36.0)
MCV: 103 fL — ABNORMAL HIGH (ref 80–100)
MONO ABS: 0.1 x10 3/mm — AB (ref 0.2–0.9)
Monocyte %: 8.3 %
Neutrophil #: 1 x10 3/mm — ABNORMAL LOW (ref 1.4–6.5)
Neutrophil %: 67.6 %
PLATELETS: 65 x10 3/mm — AB (ref 150–440)
RBC: 2.06 10*6/uL — AB (ref 3.80–5.20)
RDW: 18.9 % — ABNORMAL HIGH (ref 11.5–14.5)
WBC: 1.4 x10 3/mm — CL (ref 3.6–11.0)

## 2014-07-06 ENCOUNTER — Ambulatory Visit: Payer: Self-pay | Admitting: Internal Medicine

## 2014-07-08 LAB — CBC CANCER CENTER
Basophil #: 0 x10 3/mm (ref 0.0–0.1)
Basophil %: 0.7 %
EOS ABS: 0 x10 3/mm (ref 0.0–0.7)
Eosinophil %: 3.1 %
HCT: 26.2 % — ABNORMAL LOW (ref 35.0–47.0)
HGB: 8.6 g/dL — ABNORMAL LOW (ref 12.0–16.0)
Lymphocyte #: 0.2 x10 3/mm — ABNORMAL LOW (ref 1.0–3.6)
Lymphocyte %: 17.1 %
MCH: 33 pg (ref 26.0–34.0)
MCHC: 32.6 g/dL (ref 32.0–36.0)
MCV: 101 fL — AB (ref 80–100)
Monocyte #: 0.3 x10 3/mm (ref 0.2–0.9)
Monocyte %: 24.4 %
NEUTROS PCT: 54.7 %
Neutrophil #: 0.7 x10 3/mm — ABNORMAL LOW (ref 1.4–6.5)
Platelet: 236 x10 3/mm (ref 150–440)
RBC: 2.59 10*6/uL — ABNORMAL LOW (ref 3.80–5.20)
RDW: 19.6 % — AB (ref 11.5–14.5)
WBC: 1.3 x10 3/mm — AB (ref 3.6–11.0)

## 2014-07-08 LAB — BASIC METABOLIC PANEL
ANION GAP: 6 — AB (ref 7–16)
BUN: 17 mg/dL (ref 7–18)
CALCIUM: 9 mg/dL (ref 8.5–10.1)
CO2: 29 mmol/L (ref 21–32)
Chloride: 101 mmol/L (ref 98–107)
Creatinine: 0.83 mg/dL (ref 0.60–1.30)
EGFR (African American): >60
EGFR (Non-African Amer.): >60
GLUCOSE: 96 mg/dL (ref 65–99)
Osmolality: 273 (ref 275–301)
Potassium: 4.2 mmol/L (ref 3.5–5.1)
SODIUM: 136 mmol/L (ref 136–145)

## 2014-07-10 LAB — CBC CANCER CENTER
Basophil #: 0 x10 3/mm (ref 0.0–0.1)
Basophil %: 0.5 %
Eosinophil #: 0.1 x10 3/mm (ref 0.0–0.7)
Eosinophil %: 0.7 %
HCT: 27.1 % — ABNORMAL LOW (ref 35.0–47.0)
HGB: 8.9 g/dL — AB (ref 12.0–16.0)
LYMPHS ABS: 0.5 x10 3/mm — AB (ref 1.0–3.6)
Lymphocyte %: 5 %
MCH: 33.1 pg (ref 26.0–34.0)
MCHC: 32.7 g/dL (ref 32.0–36.0)
MCV: 101 fL — AB (ref 80–100)
MONOS PCT: 12.5 %
Monocyte #: 1.2 x10 3/mm — ABNORMAL HIGH (ref 0.2–0.9)
NEUTROS PCT: 81.3 %
Neutrophil #: 7.9 x10 3/mm — ABNORMAL HIGH (ref 1.4–6.5)
Platelet: 275 x10 3/mm (ref 150–440)
RBC: 2.68 10*6/uL — AB (ref 3.80–5.20)
RDW: 20.4 % — ABNORMAL HIGH (ref 11.5–14.5)
WBC: 9.7 x10 3/mm (ref 3.6–11.0)

## 2014-07-14 LAB — CBC CANCER CENTER
Basophil #: 0.1 x10 3/mm (ref 0.0–0.1)
Basophil %: 1.4 %
Eosinophil #: 0 x10 3/mm (ref 0.0–0.7)
Eosinophil %: 0.5 %
HCT: 29.1 % — ABNORMAL LOW (ref 35.0–47.0)
HGB: 9.5 g/dL — ABNORMAL LOW (ref 12.0–16.0)
Lymphocyte #: 0.3 x10 3/mm — ABNORMAL LOW (ref 1.0–3.6)
Lymphocyte %: 3.9 %
MCH: 32.9 pg (ref 26.0–34.0)
MCHC: 32.5 g/dL (ref 32.0–36.0)
MCV: 101 fL — ABNORMAL HIGH (ref 80–100)
MONO ABS: 1.1 x10 3/mm — AB (ref 0.2–0.9)
Monocyte %: 15.4 %
NEUTROS ABS: 5.8 x10 3/mm (ref 1.4–6.5)
NEUTROS PCT: 78.8 %
Platelet: 317 x10 3/mm (ref 150–440)
RBC: 2.88 10*6/uL — ABNORMAL LOW (ref 3.80–5.20)
RDW: 19.8 % — AB (ref 11.5–14.5)
WBC: 7.4 x10 3/mm (ref 3.6–11.0)

## 2014-07-14 LAB — BASIC METABOLIC PANEL
Anion Gap: 11 (ref 7–16)
BUN: 15 mg/dL (ref 7–18)
CALCIUM: 9 mg/dL (ref 8.5–10.1)
CHLORIDE: 100 mmol/L (ref 98–107)
Co2: 28 mmol/L (ref 21–32)
Creatinine: 0.89 mg/dL (ref 0.60–1.30)
EGFR (African American): >60
EGFR (Non-African Amer.): >60
Glucose: 105 mg/dL — ABNORMAL HIGH (ref 65–99)
Osmolality: 279 (ref 275–301)
Potassium: 4.3 mmol/L (ref 3.5–5.1)
Sodium: 139 mmol/L (ref 136–145)

## 2014-07-21 LAB — CBC CANCER CENTER
BASOS PCT: 0.8 %
Basophil #: 0 x10 3/mm (ref 0.0–0.1)
Eosinophil #: 0.1 x10 3/mm (ref 0.0–0.7)
Eosinophil %: 1.4 %
HCT: 27.8 % — ABNORMAL LOW (ref 35.0–47.0)
HGB: 9.2 g/dL — AB (ref 12.0–16.0)
Lymphocyte #: 0.4 x10 3/mm — ABNORMAL LOW (ref 1.0–3.6)
Lymphocyte %: 9.1 %
MCH: 33.2 pg (ref 26.0–34.0)
MCHC: 33.2 g/dL (ref 32.0–36.0)
MCV: 100 fL (ref 80–100)
Monocyte #: 0.6 x10 3/mm (ref 0.2–0.9)
Monocyte %: 12.1 %
Neutrophil #: 3.8 x10 3/mm (ref 1.4–6.5)
Neutrophil %: 76.6 %
Platelet: 242 x10 3/mm (ref 150–440)
RBC: 2.77 10*6/uL — ABNORMAL LOW (ref 3.80–5.20)
RDW: 18.9 % — AB (ref 11.5–14.5)
WBC: 5 x10 3/mm (ref 3.6–11.0)

## 2014-07-21 LAB — BASIC METABOLIC PANEL
Anion Gap: 8 (ref 7–16)
BUN: 20 mg/dL — ABNORMAL HIGH (ref 7–18)
CHLORIDE: 101 mmol/L (ref 98–107)
CO2: 26 mmol/L (ref 21–32)
Calcium, Total: 8.8 mg/dL (ref 8.5–10.1)
Creatinine: 0.82 mg/dL (ref 0.60–1.30)
EGFR (African American): >60
Glucose: 90 mg/dL (ref 65–99)
Osmolality: 272 (ref 275–301)
Potassium: 4.9 mmol/L (ref 3.5–5.1)
Sodium: 135 mmol/L — ABNORMAL LOW (ref 136–145)

## 2014-07-21 LAB — HEPATIC FUNCTION PANEL A (ARMC)
Albumin: 3.6 g/dL (ref 3.4–5.0)
Alkaline Phosphatase: 88 U/L
BILIRUBIN TOTAL: 0.3 mg/dL (ref 0.2–1.0)
Bilirubin, Direct: 0.1 mg/dL (ref 0.0–0.2)
SGOT(AST): 22 U/L (ref 15–37)
SGPT (ALT): 27 U/L
TOTAL PROTEIN: 7.3 g/dL (ref 6.4–8.2)

## 2014-07-28 LAB — CBC CANCER CENTER
Basophil #: 0 x10 3/mm (ref 0.0–0.1)
Basophil %: 0.5 %
EOS ABS: 0.1 x10 3/mm (ref 0.0–0.7)
EOS PCT: 1.9 %
HCT: 27.2 % — AB (ref 35.0–47.0)
HGB: 9 g/dL — AB (ref 12.0–16.0)
Lymphocyte #: 0.4 x10 3/mm — ABNORMAL LOW (ref 1.0–3.6)
Lymphocyte %: 6.9 %
MCH: 33.1 pg (ref 26.0–34.0)
MCHC: 32.9 g/dL (ref 32.0–36.0)
MCV: 100 fL (ref 80–100)
MONO ABS: 0.8 x10 3/mm (ref 0.2–0.9)
Monocyte %: 13.3 %
Neutrophil #: 4.8 x10 3/mm (ref 1.4–6.5)
Neutrophil %: 77.4 %
Platelet: 270 x10 3/mm (ref 150–440)
RBC: 2.71 10*6/uL — AB (ref 3.80–5.20)
RDW: 19.5 % — AB (ref 11.5–14.5)
WBC: 6.2 x10 3/mm (ref 3.6–11.0)

## 2014-07-28 LAB — BASIC METABOLIC PANEL
ANION GAP: 8 (ref 7–16)
BUN: 20 mg/dL — ABNORMAL HIGH (ref 7–18)
CALCIUM: 8.9 mg/dL (ref 8.5–10.1)
CHLORIDE: 99 mmol/L (ref 98–107)
CREATININE: 0.75 mg/dL (ref 0.60–1.30)
Co2: 28 mmol/L (ref 21–32)
EGFR (African American): >60
EGFR (Non-African Amer.): >60
Glucose: 91 mg/dL (ref 65–99)
OSMOLALITY: 272 (ref 275–301)
Potassium: 4.6 mmol/L (ref 3.5–5.1)
Sodium: 135 mmol/L — ABNORMAL LOW (ref 136–145)

## 2014-08-05 ENCOUNTER — Ambulatory Visit: Payer: Self-pay | Admitting: Internal Medicine

## 2014-08-11 LAB — CBC CANCER CENTER
BASOS PCT: 0.6 %
Basophil #: 0 x10 3/mm (ref 0.0–0.1)
EOS ABS: 0.1 x10 3/mm (ref 0.0–0.7)
EOS PCT: 1.8 %
HCT: 26.4 % — ABNORMAL LOW (ref 35.0–47.0)
HGB: 8.7 g/dL — ABNORMAL LOW (ref 12.0–16.0)
LYMPHS ABS: 0.3 x10 3/mm — AB (ref 1.0–3.6)
LYMPHS PCT: 7.4 %
MCH: 32.7 pg (ref 26.0–34.0)
MCHC: 33.1 g/dL (ref 32.0–36.0)
MCV: 99 fL (ref 80–100)
Monocyte #: 0.7 x10 3/mm (ref 0.2–0.9)
Monocyte %: 14.2 %
NEUTROS PCT: 76 %
Neutrophil #: 3.5 x10 3/mm (ref 1.4–6.5)
Platelet: 195 x10 3/mm (ref 150–440)
RBC: 2.67 10*6/uL — AB (ref 3.80–5.20)
RDW: 19.4 % — ABNORMAL HIGH (ref 11.5–14.5)
WBC: 4.6 x10 3/mm (ref 3.6–11.0)

## 2014-08-11 LAB — BASIC METABOLIC PANEL
ANION GAP: 8 (ref 7–16)
BUN: 17 mg/dL (ref 7–18)
CO2: 27 mmol/L (ref 21–32)
CREATININE: 0.79 mg/dL (ref 0.60–1.30)
Calcium, Total: 8.7 mg/dL (ref 8.5–10.1)
Chloride: 101 mmol/L (ref 98–107)
Glucose: 95 mg/dL (ref 65–99)
OSMOLALITY: 273 (ref 275–301)
Potassium: 4.1 mmol/L (ref 3.5–5.1)
SODIUM: 136 mmol/L (ref 136–145)

## 2014-08-11 LAB — FERRITIN: Ferritin (ARMC): 141 ng/mL (ref 8–388)

## 2014-08-18 LAB — BASIC METABOLIC PANEL
ANION GAP: 10 (ref 7–16)
BUN: 19 mg/dL — ABNORMAL HIGH (ref 7–18)
Calcium, Total: 8.4 mg/dL — ABNORMAL LOW (ref 8.5–10.1)
Chloride: 100 mmol/L (ref 98–107)
Co2: 27 mmol/L (ref 21–32)
Creatinine: 0.71 mg/dL (ref 0.60–1.30)
EGFR (African American): >60
EGFR (Non-African Amer.): >60
GLUCOSE: 88 mg/dL (ref 65–99)
Osmolality: 275 (ref 275–301)
Potassium: 4.3 mmol/L (ref 3.5–5.1)
SODIUM: 137 mmol/L (ref 136–145)

## 2014-08-18 LAB — CBC CANCER CENTER
Basophil #: 0 x10 3/mm (ref 0.0–0.1)
Basophil %: 0.6 %
EOS ABS: 0.2 x10 3/mm (ref 0.0–0.7)
Eosinophil %: 4.8 %
HCT: 25.5 % — ABNORMAL LOW (ref 35.0–47.0)
HGB: 8.4 g/dL — ABNORMAL LOW (ref 12.0–16.0)
LYMPHS ABS: 0.4 x10 3/mm — AB (ref 1.0–3.6)
LYMPHS PCT: 8.4 %
MCH: 32.2 pg (ref 26.0–34.0)
MCHC: 32.8 g/dL (ref 32.0–36.0)
MCV: 98 fL (ref 80–100)
MONO ABS: 0.7 x10 3/mm (ref 0.2–0.9)
Monocyte %: 14.8 %
NEUTROS ABS: 3.6 x10 3/mm (ref 1.4–6.5)
NEUTROS PCT: 71.4 %
PLATELETS: 138 x10 3/mm — AB (ref 150–440)
RBC: 2.6 10*6/uL — AB (ref 3.80–5.20)
RDW: 19.5 % — AB (ref 11.5–14.5)
WBC: 5 x10 3/mm (ref 3.6–11.0)

## 2014-09-05 ENCOUNTER — Ambulatory Visit: Payer: Self-pay | Admitting: Internal Medicine

## 2014-09-08 LAB — IRON AND TIBC
IRON: 38 ug/dL — AB (ref 50–170)
Iron Bind.Cap.(Total): 305 ug/dL (ref 250–450)
Iron Saturation: 12 %
Unbound Iron-Bind.Cap.: 267 ug/dL

## 2014-09-08 LAB — CBC CANCER CENTER
BASOS PCT: 1 %
Basophil #: 0 x10 3/mm (ref 0.0–0.1)
Eosinophil #: 0.1 x10 3/mm (ref 0.0–0.7)
Eosinophil %: 3.3 %
HCT: 20.8 % — AB (ref 35.0–47.0)
HGB: 6.9 g/dL — ABNORMAL LOW (ref 12.0–16.0)
Lymphocyte #: 0.3 x10 3/mm — ABNORMAL LOW (ref 1.0–3.6)
Lymphocyte %: 11.4 %
MCH: 33.3 pg (ref 26.0–34.0)
MCHC: 33.1 g/dL (ref 32.0–36.0)
MCV: 101 fL — AB (ref 80–100)
MONO ABS: 0.5 x10 3/mm (ref 0.2–0.9)
MONOS PCT: 17.3 %
Neutrophil #: 1.9 x10 3/mm (ref 1.4–6.5)
Neutrophil %: 67 %
Platelet: 219 x10 3/mm (ref 150–440)
RBC: 2.06 10*6/uL — ABNORMAL LOW (ref 3.80–5.20)
RDW: 20.5 % — ABNORMAL HIGH (ref 11.5–14.5)
WBC: 2.8 x10 3/mm — ABNORMAL LOW (ref 3.6–11.0)

## 2014-09-08 LAB — HEPATIC FUNCTION PANEL A (ARMC)
ALBUMIN: 3.5 g/dL (ref 3.4–5.0)
ALK PHOS: 67 U/L
BILIRUBIN TOTAL: 0.3 mg/dL (ref 0.2–1.0)
SGOT(AST): 14 U/L — ABNORMAL LOW (ref 15–37)
SGPT (ALT): 15 U/L
Total Protein: 7.4 g/dL (ref 6.4–8.2)

## 2014-09-08 LAB — BASIC METABOLIC PANEL
Anion Gap: 9 (ref 7–16)
BUN: 16 mg/dL (ref 7–18)
Calcium, Total: 8.4 mg/dL — ABNORMAL LOW (ref 8.5–10.1)
Chloride: 98 mmol/L (ref 98–107)
Co2: 29 mmol/L (ref 21–32)
Creatinine: 0.9 mg/dL (ref 0.60–1.30)
EGFR (African American): >60
EGFR (Non-African Amer.): >60
Glucose: 88 mg/dL (ref 65–99)
Osmolality: 273 (ref 275–301)
POTASSIUM: 3.5 mmol/L (ref 3.5–5.1)
SODIUM: 136 mmol/L (ref 136–145)

## 2014-09-08 LAB — FERRITIN: Ferritin (ARMC): 91 ng/mL (ref 8–388)

## 2014-09-08 LAB — FOLATE: Folic Acid: 7.2 ng/mL (ref 3.1–17.5)

## 2014-09-15 LAB — CBC CANCER CENTER
BASOS ABS: 0 x10 3/mm (ref 0.0–0.1)
Basophil %: 0.3 %
EOS ABS: 0.1 x10 3/mm (ref 0.0–0.7)
Eosinophil %: 1.4 %
HCT: 25.4 % — ABNORMAL LOW (ref 35.0–47.0)
HGB: 8.4 g/dL — ABNORMAL LOW (ref 12.0–16.0)
LYMPHS PCT: 6.3 %
Lymphocyte #: 0.4 x10 3/mm — ABNORMAL LOW (ref 1.0–3.6)
MCH: 32.9 pg (ref 26.0–34.0)
MCHC: 33.2 g/dL (ref 32.0–36.0)
MCV: 99 fL (ref 80–100)
MONO ABS: 0.9 x10 3/mm (ref 0.2–0.9)
Monocyte %: 12.6 %
NEUTROS PCT: 79.4 %
Neutrophil #: 5.6 x10 3/mm (ref 1.4–6.5)
PLATELETS: 325 x10 3/mm (ref 150–440)
RBC: 2.56 10*6/uL — ABNORMAL LOW (ref 3.80–5.20)
RDW: 19 % — ABNORMAL HIGH (ref 11.5–14.5)
WBC: 7.1 x10 3/mm (ref 3.6–11.0)

## 2014-09-15 LAB — CREATININE, SERUM
CREATININE: 0.75 mg/dL (ref 0.60–1.30)
EGFR (African American): >60

## 2014-09-17 LAB — CA 125: CA 125: 16.5 U/mL (ref 0.0–34.0)

## 2014-09-29 ENCOUNTER — Ambulatory Visit: Payer: Self-pay

## 2014-10-06 ENCOUNTER — Ambulatory Visit: Payer: Self-pay | Admitting: Internal Medicine

## 2014-10-31 IMAGING — CT CT CHEST W/ CM
2 of 4 series · 15 of 36 positions shown, 18 images · IV contrast (agent unspecified)
Comparison: CT abdomen/ pelvis 01/07/2014

CLINICAL DATA: Endometrial cancer

EXAM:
CT CHEST WITH CONTRAST
TECHNIQUE: Multidetector CT imaging of the chest was performed during
intravenous contrast administration.
CONTRAST:  75 cc [id number]

[Series 3: routine chest with · axial · 0.58mm/px · z∈[-566,-301]mm · 12 of 59 slices shown, 15 images]
[im 3/59  mediastinal]
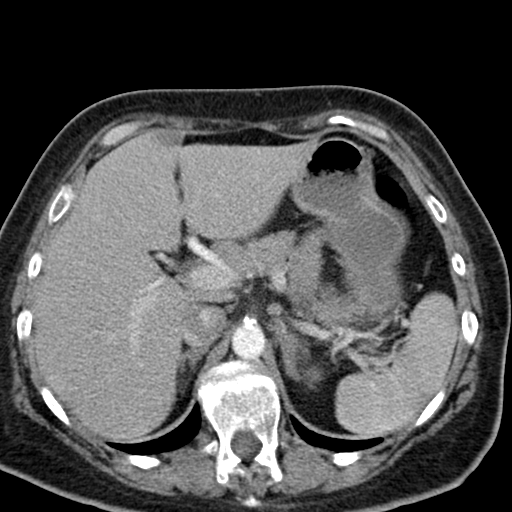
[im 3/59  lung]
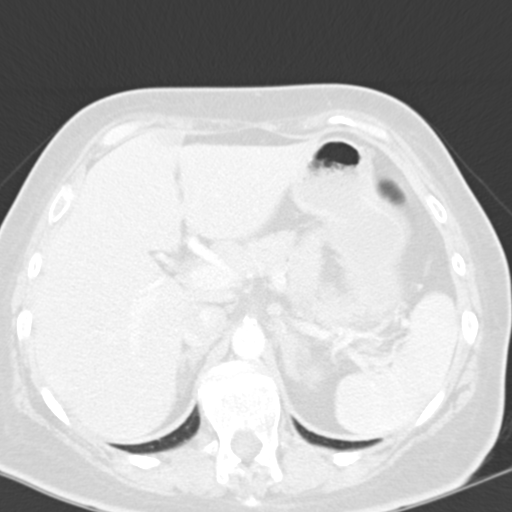
[im 8/59  lung]
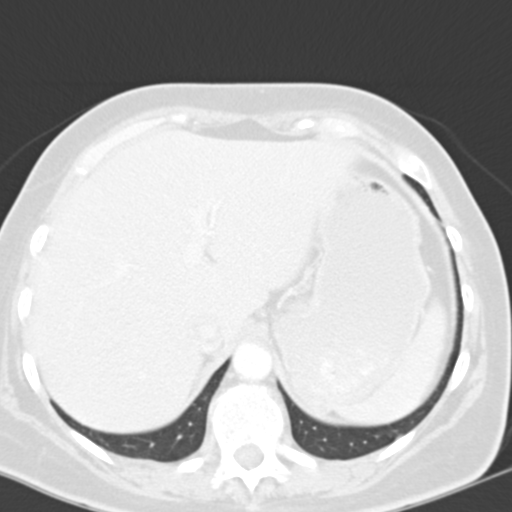
[im 14/59  lung]
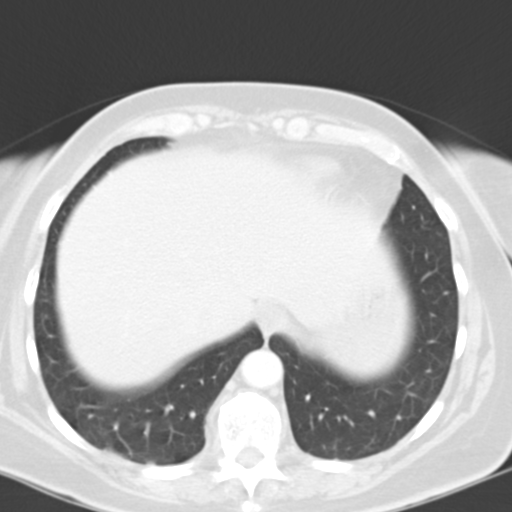
[im 19/59  lung]
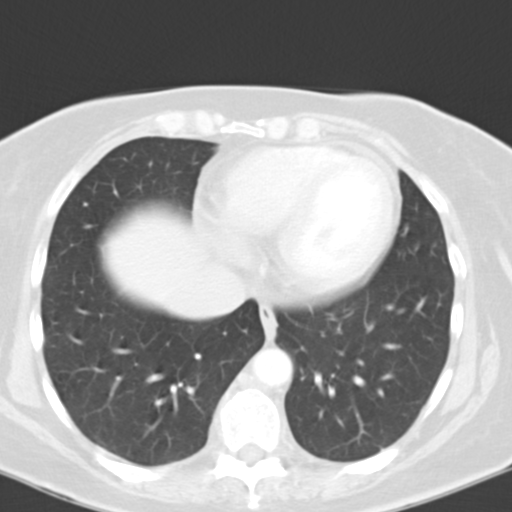
[im 22/59  mediastinal]
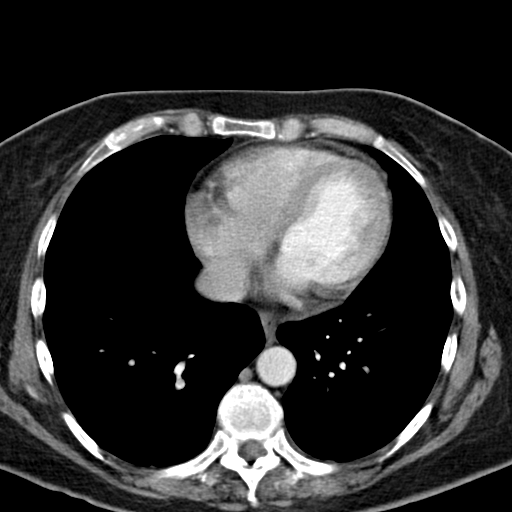
[im 22/59  lung]
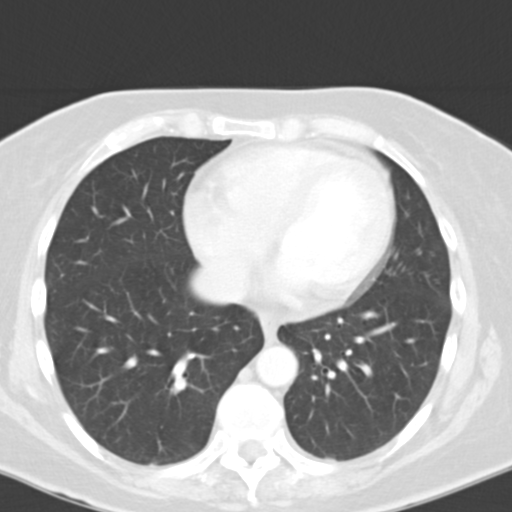
[im 27/59  lung]
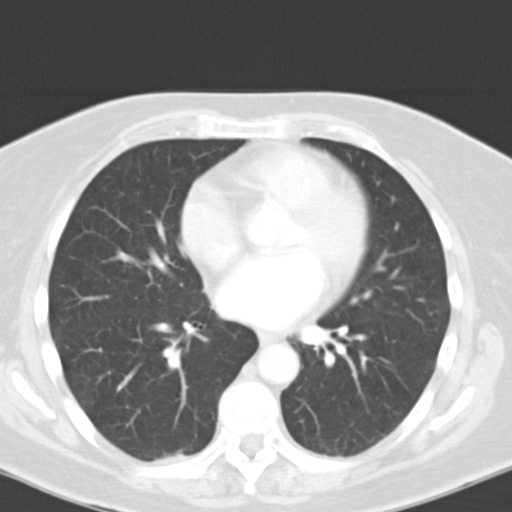
[im 32/59  lung]
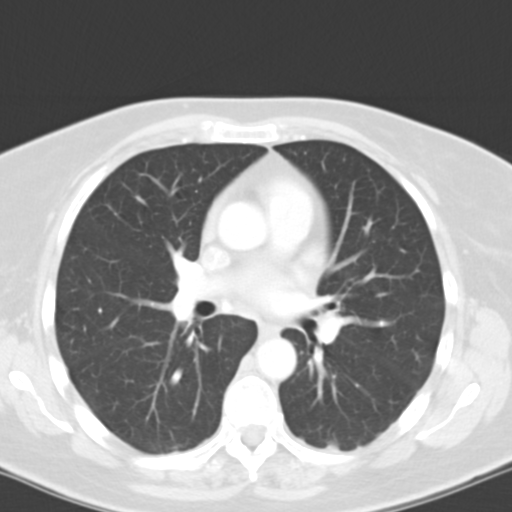
[im 37/59  lung]
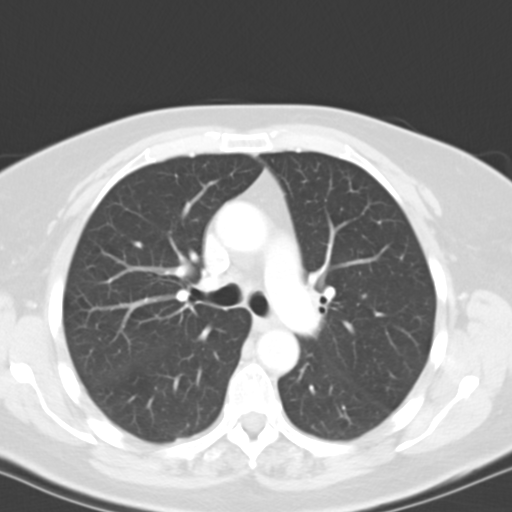
[im 40/59  mediastinal]
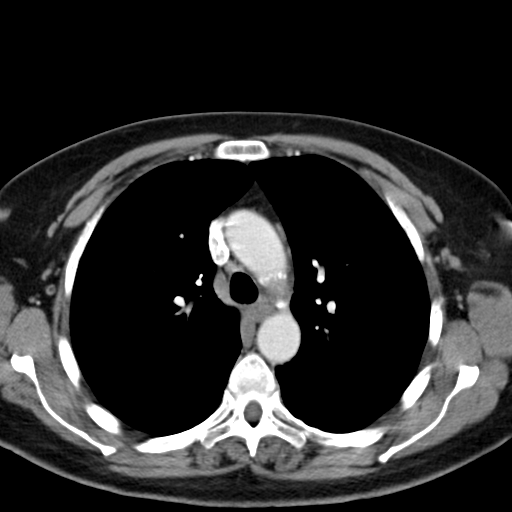
[im 40/59  lung]
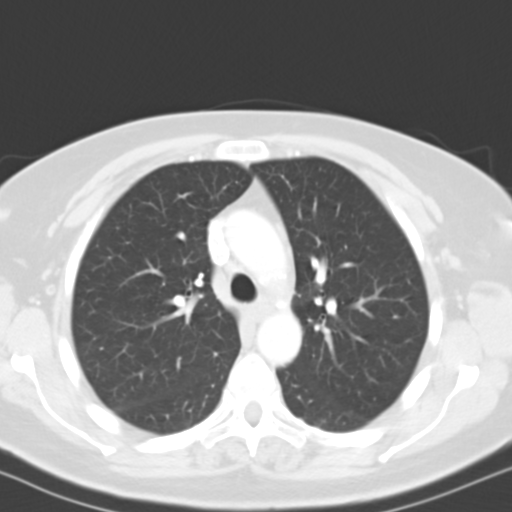
[im 45/59  lung]
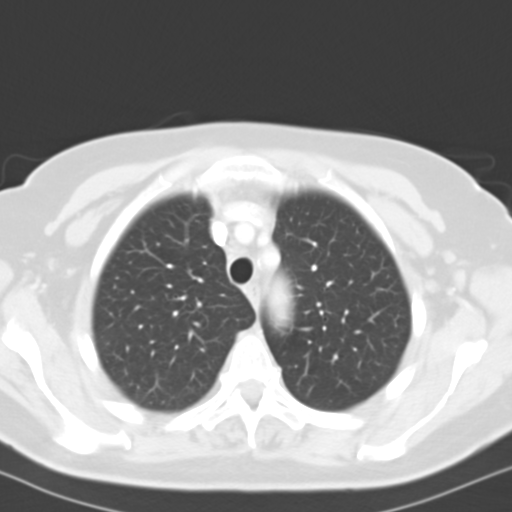
[im 51/59  lung]
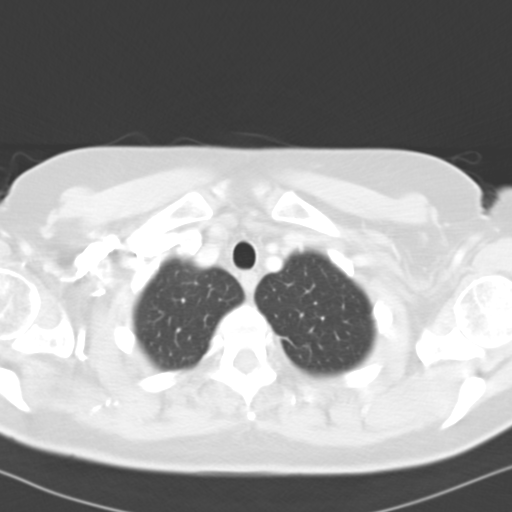
[im 56/59  lung]
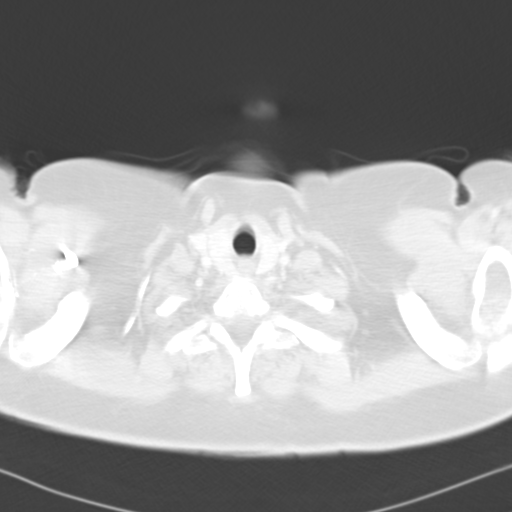

[Series 9: cor routine chest with · coronal · 0.58mm/px · 3 of 126 slices shown]
[im 26/126  lung]
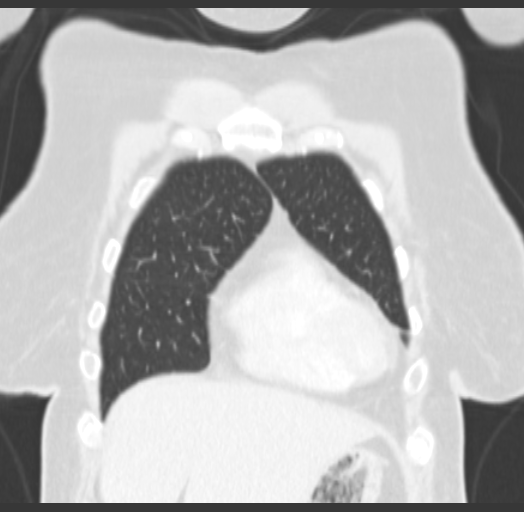
[im 51/126  lung]
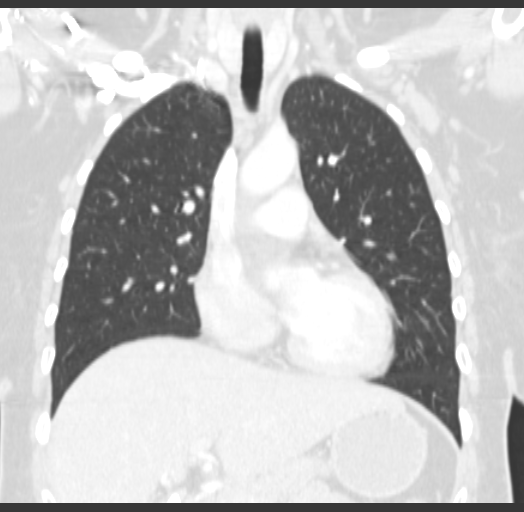
[im 76/126  lung]
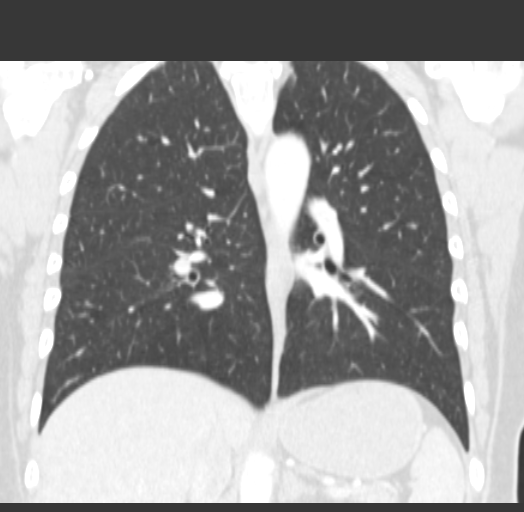

[15 of 36 positions shown; findings below may reference images not displayed]

FINDINGS: The chest wall is unremarkable. No breast masses, supraclavicular or
axillary adenopathy. Small scattered lymph nodes are noted
bilaterally. The thyroid gland appears normal. The bony thorax is
intact. No destructive bone lesions or spinal canal compromise.

The heart is normal in size. No pericardial effusion. No mediastinal
or hilar mass or adenopathy. The aorta is normal in caliber. No
dissection. The esophagus is grossly normal.

Examination of the lung parenchyma demonstrates no acute pulmonary
findings. No pleural effusion or worrisome pulmonary nodules.
Minimal areas of subpleural atelectasis are noted. The
tracheobronchial tree is unremarkable.

The upper abdomen is unremarkable.
IMPRESSION: No CT findings for metastatic disease involving the chest.

## 2014-11-12 ENCOUNTER — Ambulatory Visit
Admit: 2014-11-12 | Disposition: A | Discharge status: Home / Self Care | Payer: Self-pay | Attending: Internal Medicine | Admitting: Internal Medicine

## 2014-12-05 ENCOUNTER — Ambulatory Visit
Admit: 2014-12-05 | Disposition: A | Discharge status: Home / Self Care | Payer: Self-pay | Attending: Internal Medicine | Admitting: Internal Medicine

## 2014-12-12 ENCOUNTER — Emergency Department
Admit: 2014-12-12 | Disposition: A | Discharge status: Other Acute Inpatient Hospital | Payer: Self-pay | Admitting: Emergency Medicine

## 2014-12-12 DIAGNOSIS — G9389 Other specified disorders of brain: Secondary | ICD-10-CM | POA: Insufficient documentation

## 2014-12-12 LAB — URINALYSIS, COMPLETE
BACTERIA: NONE SEEN
BLOOD: NEGATIVE
Bilirubin,UR: NEGATIVE
GLUCOSE, UR: NEGATIVE mg/dL (ref 0–75)
LEUKOCYTE ESTERASE: NEGATIVE
NITRITE: NEGATIVE
Ph: 6 (ref 4.5–8.0)
Protein: 30
Specific Gravity: 1.02 (ref 1.003–1.030)

## 2014-12-12 LAB — CBC WITH DIFFERENTIAL/PLATELET
BASOS ABS: 0 10*3/uL (ref 0.0–0.1)
Basophil %: 0.1 %
EOS ABS: 0 10*3/uL (ref 0.0–0.7)
Eosinophil %: 0.7 %
HCT: 26.9 % — ABNORMAL LOW (ref 35.0–47.0)
HGB: 8.5 g/dL — ABNORMAL LOW (ref 12.0–16.0)
Lymphocyte #: 0.6 10*3/uL — ABNORMAL LOW (ref 1.0–3.6)
Lymphocyte %: 8.9 %
MCH: 28.4 pg (ref 26.0–34.0)
MCHC: 31.5 g/dL — ABNORMAL LOW (ref 32.0–36.0)
MCV: 90 fL (ref 80–100)
MONOS PCT: 6.2 %
Monocyte #: 0.4 x10 3/mm (ref 0.2–0.9)
NEUTROS ABS: 5.6 10*3/uL (ref 1.4–6.5)
Neutrophil %: 84.1 %
Platelet: 241 10*3/uL (ref 150–440)
RBC: 2.98 10*6/uL — ABNORMAL LOW (ref 3.80–5.20)
RDW: 16.8 % — AB (ref 11.5–14.5)
WBC: 6.6 10*3/uL (ref 3.6–11.0)

## 2014-12-12 LAB — COMPREHENSIVE METABOLIC PANEL
ALT: 15 U/L
ANION GAP: 9 (ref 7–16)
Albumin: 4.2 g/dL
Alkaline Phosphatase: 64 U/L
BUN: 14 mg/dL
Bilirubin,Total: <0.1 mg/dL — ABNORMAL LOW
CHLORIDE: 96 mmol/L — AB
CO2: 28 mmol/L
CREATININE: 0.63 mg/dL
Calcium, Total: 9.3 mg/dL
EGFR (African American): >60
Glucose: 113 mg/dL — ABNORMAL HIGH
Potassium: 3.4 mmol/L — ABNORMAL LOW
SGOT(AST): 24 U/L
Sodium: 133 mmol/L — ABNORMAL LOW
Total Protein: 8.4 g/dL — ABNORMAL HIGH

## 2014-12-15 DIAGNOSIS — C7931 Secondary malignant neoplasm of brain: Secondary | ICD-10-CM | POA: Insufficient documentation

## 2014-12-15 DIAGNOSIS — E871 Hypo-osmolality and hyponatremia: Secondary | ICD-10-CM | POA: Insufficient documentation

## 2014-12-27 NOTE — Consult Note (Signed)
ONCOLOGY followup - still weak but feels better. Vaginal bleed slowing, more clots. Denies pain issues.   no fever. No diarrhea.weak, otherwise alert and oriented, NAD.        vitals - afebrile, stable        lungs - good BS b/l       abd - soft, mild tenderness        - Hb 8.2.  Impression/Recommendations: Advanced uterine cancer admitted withh heavy vaginal bleeding, beginning to slow down today. Feels better after transfusion, Hb better at 8.2 today. Will consult Dr.Brigette Sabra Heck to see tomorrow for any other options in case bleeding worsens. Continue supportive treatment. If discharged tomorrow, have advised her to keep appts at [facility] the same. She is agreeable to this plan.  Electronic Signatures: Jonn Shingles (MD)  (Signed on 08-Jun-15 22:08)  Authored  Last Updated: 08-Jun-15 22:08 by Jonn Shingles (MD)

## 2014-12-27 NOTE — Consult Note (Signed)
Reason for Visit: This 64 year old Female patient presents to the clinic for initial evaluation of  endometrial carcinoma .   Referred by Dr Ma Hillock.  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 vaginal bleeding worsened and she was admitted to the hospital with anemia as well as hypotension. She was explored with laparoscopic surgery by Dr. Sabra Heck on 01/28/2014 with findings of adhesions noted between the omentum and abdominal wall with involvement of tumor in the left pelvic sidewall as well a small bowel loops. Biopsy was positive for well-differentiated endometrial carcinoma.CT scan showed irregular heterogeneous mass within the endometrial canal worrisome for malignancy. She also has enlarged external iliac lymph nodes suspicious for metastatic disease. MRI of the pelvis showed large endometrial mass with extrauterine extension with involvement of sigmoid colon left fallopian tube. CT scan of chest showed no evidence of disease.patient has been started on chemotherapy by medical oncology using platinum Taxol regimen. She continues to bleed he is now referred for a gynecology for consideration of palliative treatment. She is doing fairly well at this time states the bleeding seems to be slowing down somewhat.  Past Hx:    tumor on pelvic wall:    iron deficiency anemia:    tubal ligation:   Past, Family and Social History:  Past Medical History  positive   Gastrointestinal diverticulitis; GERD; reflux esophagitis   Past Surgical History tubal ligation, surgery for ruptured ovarian cyst and 72 and 62   Family History noncontributory   Social History noncontributory   Additional Past Medical and Surgical History accompanied by husband today   Allergies:   Steroids: Other, Anaphylaxis  Zithromax Z-Pak: SOB, Swelling  Other -Explain in Comment: SOB, Swelling  MSG: Headaches, Alt Ment Status  Codeine: N/V/Diarrhea  Tramadol: Other, GI Distress  Soy: Bleeding  potassium sorbate  severe stoamch cramping.: Other, GI Distress  Gluten: Other  Home Meds:  Home Medications: Medication Instructions Status  docusate-senna 50 mg-8.6 mg oral tablet 2 tab(s) orally once a day (at bedtime) Active  megestrol 40 mg oral tablet 1 tab(s) orally 2 times a day Active  Dilaudid 2 mg oral tablet 1 tab(s) orally every 4 hours - for Pain Active  LORazepam 2 mg oral tablet 1 /4 tab(s) orally 3 times a day and if needed to increase can have 1/2 tablet 3 times a day as needed for anxiety. total of 3 doses per day Active  PriLOSEC OTC 20 mg oral delayed release tablet 1 tab(s) orally once a day Active  promethazine 12.5 mg oral tablet 0.5 tab (6.25mg ) orally every 6 hours, As Needed - for Nausea, Vomiting Active  venlafaxine 37.5 mg oral capsule, extended release 1 cap(s) orally once a day (at bedtime), As Needed for anti-depressant Active  dexamethasone 4 mg oral tablet 2 tabs (8mg ) orally once a day (at bedtime) the day before chemotherapy, take 2 tabs (8mg ) orally in the morning the day of chemo. Active   Review of Systems:  General negative   Performance  Status (ECOG) 0   Skin negative   Breast negative   Ophthalmologic negative   ENMT negative   Respiratory and Thorax negative   Cardiovascular negative   Gastrointestinal negative   Genitourinary see HPI   Musculoskeletal negative   Neurological negative   Psychiatric  negative   Hematology/Lymphatics negative   Endocrine negative   Allergic/Immunologic negative   Review of Systems   review of systems obtained from nurses notes  Nursing Notes:  Nursing Vital Signs and Chemo Nursing Nursing Notes: *CC Vital Signs Flowsheet:   10-Jun-15 09:28  Temp Temperature 98  Pulse Pulse 94  Respirations Respirations 18  SBP SBP 116  DBP DBP 72  Pain Scale (0-10)  1  Current Weight (kg) (kg) 70.3  Height (cm) centimeters 162.5  BSA (m2) 1.7   Physical Exam:  General/Skin/HEENT:  General normal   Skin normal   Eyes normal   ENMT normal   Head and Neck normal   Additional PE well-developed female appears slightly anemic in NAD. Lungs are clear to A&P cardiac examination shows regular rate and rhythm. Abdomen is benign with no organomegaly or masses noted. No inguinal adenopathy is identified. Pelvic exam was not performed today.   Breasts/Resp/CV/GI/GU:  Respiratory and Thorax normal   Cardiovascular normal   Gastrointestinal normal   Genitourinary normal   MS/Neuro/Psych/Lymph:  Musculoskeletal normal   Neurological normal   Lymphatics normal   Other Results:  Radiology Results: LabUnknown:    05-May-15 16:51, CT Abdomen and Pelvis With Contrast  PACS Image     15-May-15 12:20, MRI Pelvis [facility]  PACS Image     21-May-15 14:41, CT Chest With Contrast  PACS Image   MRI:    15-May-15 12:20, MRI Pelvis [facility]  MRI Pelvis [facility]   REASON FOR EXAM:    endometrial mass  worsen for malignancy and   postmenopausal  COMMENTS:       PROCEDURE: MR  - MR PELVIS WO/W CONTRAST  - Jan 17 2014 12:20PM     CLINICAL DATA:  Left-sided abdominal and pelvic pain. Endometrial  mass. Postmenopausal female.    EXAM:  MRI PELVIS WITHOUT AND WITH CONTRAST    TECHNIQUE:  Multiplanar multisequence MR imaging of the pelvis was performed  both before and after administration of intravenous contrast.  CONTRAST:  15 mL MultiHance    COMPARISON:  CT  on 01/07/2014    FINDINGS:  A large hypovascular soft tissue mass is seen distending the  endometrial cavity and endocervical canal. Deep myometrial invasion  and extension through the uterine serosa is seen in the left  posterior fundal region. Extra uterine tumor invades the left  fallopian tube causing nonobstructive hydrosalpinx, and also the  sigmoid colon along the anterior aspect of the uterine fundus. Soft  tissue stranding involving the sigmoid mesocolon is also consistent  with extrauterine tumor. A small amount of ascites is seen in the  anterior pelvis.  No evidence of vaginal involvement, or definite cervical stromal  invasion. No other adnexal masses are identified.    Mild left external iliac lymphadenopathy is seen on image 29 of  series 14 with largest lymph node measuring 12 mm. A an 8 mm right  external iliac lymph node is seen on image 27 of series 14, and  metastatic disease cannot be excluded.     IMPRESSION:  Large endometrial mass showing extrauterine extension in the left  posterior fundus, with involvement of the adjacent sigmoid colon and  left fallopian tube. (  FIGO stage IVA).    Mild left external iliac lymphadenopathy, suspicious for lymph node  metastasis. 8 mm right external iliac lymph node also noted, and  metastatic disease cannot be excluded.    Obstructive left hydrosalpinx.  No evidence of bowel obstruction.    Mild ascites.      Electronically Signed    By: Earle Gell M.D.    On: 01/17/2014 13:48         Verified By: Marlaine Hind, M.D.,  CT:    05-May-15 16:51, CT Abdomen and Pelvis With Contrast  CT Abdomen and Pelvis With Contrast   REASON FOR EXAM:    Call Report Dawn [id number]  have Pt Stay    weight loss       generalized abd pain  COMMENTS:       PROCEDURE: CT  - CT ABDOMEN / PELVIS  W  - Jan 07 2014  4:51PM     CLINICAL DATA:  Generalized abdominal pain for 1 year. Worsened over  last 3 weeks.    EXAM:  CT ABDOMEN AND  PELVIS WITH CONTRAST    TECHNIQUE:  Multidetector CT imaging of the abdomen and pelvis was performed  using the standard protocol following bolus administration of  intravenous contrast.    CONTRAST:  100 ccof Isovue-300    COMPARISON:  None available    FINDINGS:  3 mm pleural-based nodule partially visualized within the lingula  (series 6, image 1). Additional tiny 3 mm pleural-based nodules  partially visualized at the left lower lobe (series 6, image 1). 6  mm pleural-based nodule seen more inferiorly within the left lower  lobe (series 6, image 15). Visualized lung bases are otherwise  clear.    The liver demonstrates a normal contrast enhanced appearance.  Probable focal fat deposition noted adjacent to the fissure for  ligamentum teres. Multiple calcified gallstones present within the  gallbladder lumen without CT evidence of acute cholecystitis. No  biliary ductal dilatation. The spleen is unremarkable. Mild  thickening of the left adrenal gland noted without discrete lesion.  Pancreas is normal.    The kidneys are equal in size with symmetric enhancement. No  nephrolithiasis, hydronephrosis, or focal enhancing renal mass.    No evidence of bowel obstruction. Sigmoid diverticulosis present  without evidence of acute diverticulitis. No acute inflammatory  changes seen about the bowels.    Bladder is partially distended but grossly normal.  A heterogeneous hypodense mass measuring approximately 5.3 x 3.9 x  5.2 cm is present withinthe endometrial canal. There irregular  peripheral hyper enhancement about this lesion. The mass extends  through the myometrium in may violate the serosa along the left  anterior lateral aspect of the uterine body (series 2, image 69).  There is associated small volume free fluid within the left pelvic  cul-de-sac. There is also question of direct extension into the  cervix (series 8, image 83). Findings are concerning for  endometrial  carcinoma.    2.4 cm round well-circumscribed hypodensity seen within the  subcutaneous fat of the right perineum (series 2, image 97),  indeterminate, may represent a sebaceous cyst.    No free intraperitoneal air identified.  A 1.2 cm left external iliac lymph node is present (series 2, image  68). Right external iliac node measures up to 1.2 cm (series 2,  image 67). Mildly prominent 8 mm right common iliac node noted  (series 2, image 49). No other pathologically enlarged lymph nodes  identified.  Moderate aortobiiliac atherosclerotic calcifications noted.    Remote compression deformity of the T12 vertebral body noted. No  acute osseous abnormality. No worrisome lytic or blastic osseous  lesions.     IMPRESSION:  1. Irregular heterogeneous mass within the endometrial canal,  worrisome for endometrial carcinoma. There is extension of this  lesion through the myometrium along the left anterolateral aspect of  the uterine body with possible extension through the uterine serosa.  Gynecologic follow-up recommended.  2. Enlarged left external iliac lymph nodes as above, worrisome for  nodal metastases.  3. Small volume free fluid within the left pelvic cul-de-sac.  4. Sigmoid diverticulosis without acute diverticulitis.  5. Cholelithiasis without acute cholecystitis.  6. Tiny pleural-based nodules within the lung bases as above,  incompletely evaluated. Attention on follow-up recommended.  7. 2 cm subcapsular hypodensity within the anterior left hepatic  lobe. Finding is favored to reflect focal fat deposition.      Electronically Signed   By: Jeannine Boga M.D.    On: 01/07/2014 17:11         Verified By: Neomia Glass, M.D.,    21-May-15 14:41, CT Chest With Contrast  CT Chest With Contrast   REASON FOR EXAM:    LABS FIRST Endometrial CA Staging Workup  COMMENTS:       PROCEDURE: CT  - CT CHEST WITH CONTRAST  - Jan 23 2014  2:41PM      CLINICAL DATA:  Endometrial cancer    EXAM:  CT CHEST WITH CONTRAST    TECHNIQUE:  Multidetector CT imaging of the chest was performed during  intravenous contrast administration.    CONTRAST:  75 cc Isovue-300  COMPARISON:  CT abdomen/ pelvis 01/07/2014    FINDINGS:  The chest wall is unremarkable. No breast masses, supraclavicular or  axillary adenopathy. Small scattered lymph nodes are noted  bilaterally. The thyroid gland appears normal. The bony thorax is  intact. No destructive bone lesions or spinal canal compromise.    The heart is normal in size. No pericardial effusion. No mediastinal  or hilar mass or adenopathy. The aorta is normal in caliber. No  dissection. The esophagus is grossly normal.    Examination of the lung parenchyma demonstrates no acute pulmonary  findings. No pleural effusion or worrisome pulmonary nodules.  Minimal areas of subpleural atelectasis are noted. The  tracheobronchial tree is unremarkable.    The upper abdomen is unremarkable.     IMPRESSION:  No CT findings for metastatic disease involving the chest.      Electronically Signed    By: Kalman Jewels M.D.    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  Plan:   at this time patient needs radiation therapy to the pelvis to assist with decreasing or eliminating her pelvic bleeding. I will plan on delivering 4500 cGy over 5 weeks and then have her reevaluated by GYN oncology for possible attempt at surgical resection. Patient will also receive concurrent chemotherapy under medical oncology's direction. I have set her up an urgent basis  tomorrow for simulation and will begin treatments first thing next week. Risks and benefits of treatment including diarrhea, dysuria,  fatigue, lower blood counts, possible bowel toxicity, alteration of blood counts all explained in detail to the patient and her husband. Both seem to comprehend my treatment planwell. Depending on GYN oncology's findings after completion of radiation we'll make further recommendations at time about further adjuvant radiation.  I would like to take this opportunity for allowing me to participate in the care of your patient..  Electronic Signatures: Armstead Peaks (MD)  (Signed 10-Jun-15 11:49)  Authored: HPI, Diagnosis, Past Hx, PFSH, Allergies, Home Meds, ROS, Nursing Notes, Physical Exam, Other Results, Relevent Results, Encounter Assessment and Plan   Last Updated: 10-Jun-15 11:49 by Armstead Peaks (MD)

## 2014-12-27 NOTE — Discharge Summary (Signed)
PATIENT NAME:  Kathy Wagner, Kathy Wagner MR#:  [id number] DATE OF BIRTH:  Aug 28, 1951  DATE OF ADMISSION:  02/08/2014 DATE OF DISCHARGE:  02/11/2014  DISCHARGE DIAGNOSES:  1. Symptomatic anemia due to acute on chronic endometrial blood loss, likely in the setting of advanced carcinoma, status post 2 units of packed red blood cell transfusion, and now hemoglobin has stabilized.  2. Hypokalemia, repleted.   SECONDARY DIAGNOSES:  1. Endometrial carcinoma.  2. History of iron deficiency anemia.  3. Gastroesophageal reflux disease. 4. Anxiety.   CONSULTATIONS:  1. Oncology, Dr. Ma Hillock.  2. OB/GYN, Dr. Enzo Bi.   PROCEDURES AND RADIOLOGY: Ultrasound of the pelvis on 7th of June showed empty urinary bladder. Endometrial thickening measuring at least 25 mm consistent with known history of endometrial cancer.   HISTORY AND SHORT HOSPITAL COURSE: The patient is a 64 year old female with the above-mentioned medical problems, who was admitted for weakness and dizziness, thought to be secondary to vaginal bleeding. Please see Dr. Chana Bode Patel's dictated history and physical for further details. The patient was found to have symptomatic anemia. Her hemoglobin was down to 7.2. Considering her symptoms, she was transfused 1 unit. Her hemoglobin actually dropped to 6.9 and was given another unit, and OB/GYN consultation was obtained with Dr. Enzo Bi, who recommended just symptomatic treatment at this time. Oncology consultation was obtained with Dr. Ma Hillock, who was also in agreement and recommended outpatient followup at oncology office once she is hemodynamically stable. By 9th of June, the patient did not have any major vaginal bleed. Her hemoglobin had remained stable and was discharged home in stable condition.   PERTINENT PHYSICAL EXAMINATION ON THE DATE OF DISCHARGE:  VITAL SIGNS: As follows: Temperature 98.6, heart rate 85 per minute, respirations 20 per minute, blood pressure 121/73 mmHg. She was  saturating 97% on room air.  CARDIOVASCULAR: S1, S2 normal. No murmurs, rubs or gallop.  LUNGS: Clear to auscultation bilaterally. No wheezing, rales, rhonchi or crepitation.  ABDOMEN: Soft, benign.  NEUROLOGICAL: Nonfocal examination.  All other physical examination remained at baseline.   DISCHARGE MEDICATIONS:  1. Dilaudid 2 mg p.o. every 4 hours.  2. Megestrol 40 mg p.o. b.i.d.  3. Docusate/senna 2 tablets p.o. at bedtime.  4. Lorazepam 2 mg 1/4 tablet orally 3 times a day, and if needed, increased to 1/2 tablet 3 times a day as needed, up to a maximum of 3 doses per day.  5. Prilosec OTC 20 mg p.o. daily.  6. Promethazine 6.25 mg p.o. every 6 hours as needed.  7. Venlafaxine 37.5 mg p.o. at bedtime as needed.  8. Dexamethasone 8 mg p.o. at bedtime the day before chemotherapy and 8 mg p.o. every morning on the day of chemotherapy.  9. Zofran 4 mg p.o. every 6 hours as needed.   DISCHARGE DIET: Low sodium.   DISCHARGE ACTIVITY: As tolerated.   DISCHARGE INSTRUCTIONS AND FOLLOWUP: The patient was instructed to follow up with Dr. Ma Hillock in 1 to 2 days. She will need followup with [facility] in 2 to 4 weeks, and she was also scheduled to see Dr. Jacquelyne Balint.   TOTAL TIME DISCHARGING THIS PATIENT: 55 minutes.    ____________________________ Lucina Mellow. Manuella Ghazi, MD vss:lb D: 02/13/2014 11:08:58 ET T: 02/13/2014 11:25:39 ET JOB#: [id number]  cc: [employer] [place] S. Manuella Ghazi, MD, <Dictator> Sandeep R. Ma Hillock, MD Weber Cooks, MD Alanda Slim DeFrancesco, MD Remer Macho MD ELECTRONICALLY SIGNED 02/13/2014 12:24

## 2014-12-27 NOTE — H&P (Signed)
PATIENT NAME:  Kathy Wagner, Kathy Wagner MR#:  [id number] DATE OF BIRTH:  [birth year]  DATE OF ADMISSION:  02/08/2014  PRIMARY CARE PROVIDER: None.   ONCOLOGIST: Weber Cooks, MD, and Sandeep R. Ma Hillock, MD   CHIEF COMPLAINT: Weakness, dizziness, as well as vaginal bleeding.   HISTORY OF PRESENT ILLNESS: The patient is a 64 year old white female with the diagnosis of endometrial cancer, who has been followed by Dr. Ma Hillock as well as Dr. Jacquelyne Balint, who did a diagnostic laparoscopy under anesthesia in May. The patient presents today with worsening vaginal bleed, ongoing for the past few weeks, along with feeling very dizzy and tired. When she initially arrived, her blood pressure was in the 90s, had received 1 liter of fluid bolus and blood pressure improved. The patient otherwise denies any chest pain or palpitations. Denies any nausea, has chronic abdominal pain. Denies any fevers or chills.   PAST MEDICAL HISTORY: Endometrial carcinoma, history of iron-deficiency anemia, GERD, anxiety related to her cancer.   PAST SURGICAL HISTORY: Status post surgery for ruptured ovarian cyst in 1973 and 1992.   ALLERGIES: TO STEROIDS, ZITHROMAX, CODEINE, TRAMADOL, POTASSIUM SORBATE, GLUTEN.   MEDICATIONS: Venlafaxine 37.5 mg 1 tab p.o. daily, promethazine 12.5 mg q.6 hours p.r.n., Prilosec 20 mg daily, Zofran 4 mg 1 tab p.o. q.6 hours p.r.n., Megace 40 mg 1 tab p.o. b.i.d., lorazepam 2 mg 1/4 tab t.i.d. as needed, Colace with senna 2 tabs daily, Dilaudid 2 mg q.4 hours p.r.n. for pain.   SOCIAL HISTORY: Does not smoke. Does not drink. No drugs.   FAMILY HISTORY: Positive for hypertension.   REVIEW OF SYSTEMS:  CONSTITUTIONAL: Denies any fevers. Complains of fatigue, weakness, lower abdominal pain.  EYES: No blurred or double vision. No pain. No redness. No inflammation.  ENT: No tinnitus. No ear pain. No hearing loss. No seasonal or year-round allergies. No epistaxis. No difficulty swallowing.   RESPIRATORY: Denies any cough, wheezing, hemoptysis. No dyspnea.  CARDIOVASCULAR: Denies any chest pain, orthopnea, edema or arrhythmia.  GASTROINTESTINAL: No nausea, vomiting, diarrhea. Has chronic suprapubic abdominal pain. No hematemesis. Does have GERD. No IBS. No jaundice.  GENITOURINARY: Denies any dysuria, hematuria, renal colic.  ENDOCRINE: Denies any polyuria, nocturia or thyroid problems.  HEMATOLOGIC/LYMPHATIC: History of iron deficiency anemia.  SKIN: No acne. No rash.  MUSCULOSKELETAL: Denies any pain in the neck, back, or shoulder.  NEUROLOGIC: No numbness, CVA, TIA or seizures.  PSYCHIATRIC: Has some anxiety. No insomnia or ADD.   PHYSICAL EXAMINATION: VITAL SIGNS: Temperature 99.4, pulse 95, respirations 18, blood pressure 128/63, O2 99%.  GENERAL: The patient is a well-developed female, appears very pale, mildly anxious.  HEENT: Head atraumatic, normocephalic. Pupils equally round, reactive to light and accommodation. There is no conjunctival pallor. No scleral icterus. Nasal exam shows no drainage or ulceration. Oropharynx: Clear without any exudate.  NECK: Supple without any JVD.  CARDIOVASCULAR: Regular rate and rhythm.  LUNGS: Clear to auscultation bilaterally without any rales, rhonchi, wheezing.  ABDOMEN: Soft. She has some suprapubic tenderness. No guarding. No rebound.  SKIN: No rash.  LYMPHATICS: No lymph nodes palpable.  VASCULAR: Good DP and PT pulses.  PSYCHIATRIC: The patient appears mildly anxious.  NEUROLOGIC: Awake, alert, oriented x3. No focal deficits.   LABORATORY DATA: Glucose 118, BUN 10, creatinine 0.93, sodium 139, potassium 3.1, chloride 105, CO2 is 25. WBC 10.8, hemoglobin 7.2, platelet count 381,000.   ASSESSMENT AND PLAN: The patient is a 64 year old white female with history of endometrial cancer who presents with dizziness  and weakness. I and the ED physician spoke to the GYN doctor on call, who will evaluate the patient in the morning.   1. Symptomatic anemia due to endometrial blood loss and Dr. Enzo Bi to see. Pelvic ultrasound will be ordered. Will also ask oncology to see as well. At this time, she will be transfused 1 unit of packed red blood cells for symptomatic anemia. Will follow her hemoglobin and transfuse as needed.  2. Endometrial cancer. Will continue Megace. Oncology and GYN consult.  3. Anxiety. Will continue Ativan p.r.n.  4. Gastroesophageal reflux disease. Will continue omeprazole.  5. Miscellaneous. The patient will be on sequential compression devices for deep vein thrombosis prophylaxis.    TIME SPENT: 50 minutes.    ____________________________ Lafonda Mosses Posey Pronto, MD shp:lm D: 02/08/2014 20:23:09 ET T: 02/08/2014 21:40:11 ET JOB#: [id number]  cc: Danee Soller H. Posey Pronto, MD, <Dictator> Alric Seton MD ELECTRONICALLY SIGNED 02/18/2014 16:51

## 2014-12-27 NOTE — Consult Note (Signed)
History of Present Illness:  Reason for Consult Endometrial cancer with active uterine bleeding   HPI   Patient underwent laparoscopy by Dr Sabra Heck 01/28/14 with following findings-" At the time of laparoscopy, significant adhesions were noted between omentum and abdominal wall.  The diaphragm and liver appeared normal.  In the pelvis dense adhesions were noted between uterus, large bowel and some loops of small bowel and between uterus the left pelvic sidewall.  The adnexa could not be seen.  Increased vascularity on the uterine serosa and on the bladder, peritoneum and left pelvic sidewall peritoneum. "  Patient presented on 02/08/14 with worsening vaginal bleed, ongoing for the past few days, along with feeling very dizzy and tired. When she initially arrived, her blood pressure was in the 90s,Hb 7.2 and , had received 1 liter of fluid bolus and blood pressure improved. The patient otherwise denies any chest pain or palpitations. Denies any nausea, has chronic abdominal pain. Denies any fevers or chills.   PFSH:  Additional Past Medical and Surgical History Past Medical History/Past Surgical History -  Denies any known history of hypertension, diabetes or other chronic medical issues. Surgery for ruptured ovarian cysts in 1973 and 1992. On 01/02/14, colonoscopy showed Diverticulosis in the sigmoid colon otherwise unremarkable, and EGD showed LA grade B reflux esophagitis.  Family History - denies hematological disorders or malignancy.  Social History - denies smoking, alcohol or recreational drug usage. Physically active and ambulatory.   Review of Systems:  General weakness  fatigue   Performance Status (ECOG) 2   HEENT no complaints   Lungs no complaints   Cardiac no complaints   GI nausea   GU no complaints   Musculoskeletal no complaints   Extremities no complaints   Skin no complaints   Neuro no complaints   Endocrine no complaints   Psych no complaints   NURSING  NOTES: ED Vital Sign Flow Sheet:   06-Jun-15 19:50   Pulse Pulse: 93   Respirations Respirations: 18   SBP SBP: 101   DBP DBP: 52   Pulse Ox % Pulse Ox %: 98   Pulse Ox Source Source: Room Air   Pain Scale (0-10) Pain Scale (0-10): Scale:6    20:55   Temp Temperature: 98.6   Temp Source: oral   Physical Exam:  General Pale female in no acute distress   HEENT: normal   Lungs: clear   Cardiac: regular rate, rhythm   Breast: not examined   Abdomen: soft  nontender  distended   Skin: intact   Extremities: No edema, rash or cyanosis   Neuro: AAOx3   Psych: normal appearance    Steroids: Other, Anaphylaxis  Zithromax Z-Pak: SOB, Swelling  Other -Explain in Comment: SOB, Swelling  MSG: Headaches, Alt Ment Status  Codeine: N/V/Diarrhea  Tramadol: Other, GI Distress  Soy: Bleeding  potassium sorbate  severe stoamch cramping.: Other, GI Distress  Gluten: Other  Laboratory Results: Routine Chem:  07-Jun-15 07:01   Glucose, Serum  100  BUN 11  Creatinine (comp) 0.61  Potassium, Serum  3.1  Chloride, Serum 106  CO2, Serum 23  Calcium (Total), Serum  7.9  Anion Gap 8  Osmolality (calc) 273  eGFR (African American) >60  eGFR (Non-African American) >60 (eGFR values <31m/min/1.73 m2 may be an indication of chronic kidney disease (CKD). Calculated eGFR is useful in patients with stable renal function. The eGFR calculation will not be reliable in acutely ill patients when serum creatinine is changing rapidly. It  is not useful in  patients on dialysis. The eGFR calculation may not be applicable to patients at the low and high extremes of body sizes, pregnant women, and vegetarians.)  Routine Hem:  07-Jun-15 07:01   WBC (CBC) 8.8  RBC (CBC)  2.82  Hemoglobin (CBC)  6.9  Hematocrit (CBC)  21.7  Platelet Count (CBC) 306  MCV  77  MCH  24.4  MCHC  31.7  RDW  25.7  Neutrophil % 76.8  Lymphocyte % 14.2  Monocyte % 3.6  Eosinophil % 4.8  Basophil %  0.6  Neutrophil #  6.8  Lymphocyte # 1.3  Monocyte # 0.3  Eosinophil # 0.4  Basophil # 0.0 (Result(s) reported on 09 Feb 2014 at 07:14AM.)   Radiology Results: MRI:    15-May-15 12:20, MRI Pelvis WWO  MRI Pelvis WWO   REASON FOR EXAM:    endometrial mass  worsen for malignancy and   postmenopausal  COMMENTS:       PROCEDURE: MR  - MR PELVIS WO/W CONTRAST  - Jan 17 2014 12:20PM     CLINICAL DATA:  Left-sided abdominal and pelvic pain. Endometrial  mass. Postmenopausal female.    EXAM:  MRI PELVIS WITHOUT AND WITH CONTRAST    TECHNIQUE:  Multiplanar multisequence MR imaging of the pelvis was performed  both before and after administration of intravenous contrast.  CONTRAST:  15 mL MultiHance    COMPARISON:  CT on 01/07/2014    FINDINGS:  A large hypovascular soft tissue mass is seen distending the  endometrial cavity and endocervical canal. Deep myometrial invasion  and extension through the uterine serosa is seen in the left  posterior fundal region. Extra uterine tumor invades the left  fallopian tube causing nonobstructive hydrosalpinx, and also the  sigmoid colon along the anterior aspect of the uterine fundus. Soft  tissue stranding involving the sigmoid mesocolon is also consistent  with extrauterine tumor. A small amount of ascites is seen in the  anterior pelvis.  No evidence of vaginal involvement, or definite cervical stromal  invasion. No other adnexal masses are identified.    Mild left external iliac lymphadenopathy is seen on image 29 of  series 14 with largest lymph node measuring 12 mm. A an 8 mm right  external iliac lymph node is seen on image 27 of series 14, and  metastatic disease cannot be excluded.     IMPRESSION:  Large endometrial mass showing extrauterine extension in the left  posterior fundus, with involvement of the adjacent sigmoid colon and  left fallopian tube. (FIGO stage IVA).    Mild left external iliac lymphadenopathy, suspicious  for lymph node  metastasis. 8 mm right external iliac lymph node also noted, and  metastatic disease cannot be excluded.    Obstructive left hydrosalpinx.  No evidence of bowel obstruction.    Mild ascites.      Electronically Signed    By: Earle Gell M.D.    On: 01/17/2014 13:48         Verified By: Marlaine Hind, M.D.,  CT:    05-May-15 16:51, CT Abdomen and Pelvis With Contrast  CT Abdomen and Pelvis With Contrast   REASON FOR EXAM:    Call Report Dawn [id number]  have Pt Stay    weight loss       generalized abd pain  COMMENTS:       PROCEDURE: CT  - CT ABDOMEN / PELVIS  W  - Jan 07 2014  4:51PM     CLINICAL DATA:  Generalized abdominal pain for 1 year. Worsened over  last 3 weeks.    EXAM:  CT ABDOMEN AND PELVIS WITH CONTRAST    TECHNIQUE:  Multidetector CT imaging of the abdomen and pelvis was performed  using the standard protocol following bolus administration of  intravenous contrast.    CONTRAST:  100 ccof Isovue-300    COMPARISON:  None available    FINDINGS:  3 mm pleural-based nodule partially visualized within the lingula  (series 6, image 1). Additional tiny 3 mm pleural-based nodules  partially visualized at the left lower lobe (series 6, image 1). 6  mm pleural-based nodule seen more inferiorly within the left lower  lobe (series 6, image 15). Visualized lung bases are otherwise  clear.    The liver demonstrates a normal contrast enhanced appearance.  Probable focal fat deposition noted adjacent to the fissure for  ligamentum teres. Multiple calcified gallstones present within the  gallbladder lumen without CT evidence of acute cholecystitis. No  biliary ductal dilatation. The spleen is unremarkable. Mild  thickening of the left adrenal gland noted without discrete lesion.  Pancreas is normal.    The kidneys are equal in size with symmetric enhancement. No  nephrolithiasis, hydronephrosis, or focal enhancing renal mass.    No evidence of bowel  obstruction. Sigmoid diverticulosis present  without evidence of acute diverticulitis. No acute inflammatory  changes seen about the bowels.    Bladder is partially distended but grossly normal.  A heterogeneous hypodense mass measuring approximately 5.3 x 3.9 x  5.2 cm is present withinthe endometrial canal. There irregular  peripheral hyper enhancement about this lesion. The mass extends  through the myometrium in may violate the serosa along the left  anterior lateral aspect of the uterine body (series 2, image 69).  There is associated small volume free fluid within the left pelvic  cul-de-sac. There is also question of direct extension into the  cervix (series 8, image 83). Findings are concerning for endometrial  carcinoma.    2.4 cm round well-circumscribed hypodensity seen within the  subcutaneous fat of the right perineum (series 2, image 97),  indeterminate, may represent a sebaceous cyst.    No free intraperitoneal air identified.  A 1.2 cm left external iliac lymph node is present (series 2, image  68). Right external iliac node measures up to 1.2 cm (series 2,  image 67). Mildly prominent 8 mm right common iliac node noted  (series 2, image 49). No other pathologically enlarged lymph nodes  identified.    Moderate aortobiiliac atherosclerotic calcifications noted.    Remote compression deformity of the T12 vertebral body noted. No  acute osseous abnormality. No worrisome lytic or blastic osseous  lesions.     IMPRESSION:  1. Irregular heterogeneous mass within the endometrial canal,  worrisome for endometrial carcinoma. There is extension of this  lesion through the myometrium along the left anterolateral aspect of  the uterine body with possible extension through the uterine serosa.  Gynecologic follow-up recommended.  2. Enlarged left external iliac lymph nodes as above, worrisome for  nodal metastases.  3. Small volume free fluid within the left pelvic  cul-de-sac.  4. Sigmoid diverticulosis without acute diverticulitis.  5. Cholelithiasis without acute cholecystitis.  6. Tiny pleural-based nodules within the lung bases as above,  incompletely evaluated. Attention on follow-up recommended.  7. 2 cm subcapsular hypodensity within the anterior left hepatic  lobe. Finding is favored to reflect focal fat deposition.  Electronically Signed   By: Jeannine Boga M.D.    On: 01/07/2014 17:11         Verified By: Neomia Glass, M.D.,    21-May-15 14:41, CT Chest With Contrast  CT Chest With Contrast   REASON FOR EXAM:    LABS FIRST Endometrial CA Staging Workup  COMMENTS:       PROCEDURE: CT  - CT CHEST WITH CONTRAST  - Jan 23 2014  2:41PM     CLINICAL DATA:  Endometrial cancer    EXAM:  CT CHEST WITH CONTRAST    TECHNIQUE:  Multidetector CT imaging of the chest was performed during  intravenous contrast administration.    CONTRAST:  75 cc Isovue-300  COMPARISON:  CT abdomen/ pelvis 01/07/2014    FINDINGS:  The chest wall is unremarkable. No breast masses, supraclavicular or  axillary adenopathy. Small scattered lymph nodes are noted  bilaterally. The thyroid gland appears normal. The bony thorax is  intact. No destructive bone lesions or spinal canal compromise.    The heart is normal in size. No pericardial effusion. No mediastinal  or hilar mass or adenopathy. The aorta is normal in caliber. No  dissection. The esophagus is grossly normal.    Examination of the lung parenchyma demonstrates no acute pulmonary  findings. No pleural effusion or worrisome pulmonary nodules.  Minimal areas of subpleural atelectasis are noted. The  tracheobronchial tree is unremarkable.    The upper abdomen is unremarkable.     IMPRESSION:  No CT findings for metastatic disease involving the chest.      Electronically Signed    By: Kalman Jewels M.D.    On: 01/23/2014 17:01         Verified By: Marlane Hatcher, M.D.,   Assessment and Plan: Impression:    1. Newly diagnosed endometrial cancer- CT scan on 01/07/14 reported irregular heterogeneous mass within the endometrial canal worrisome for endometrial carcinoma with extension of this lesion through the myometrium along the left anterolateral aspect of the uterine body with possible extension through the uterine serosa, enlarged left external iliac lymph nodes, small volume free fluid within the left pelvic cul-de-sac, tiny pleural-based nodules within the lung bases -Following with DR Ma Hillock and Dr Miller-recent chemotherapy.bleeding- agree with aggressive transfusion- unclear surgical options if any for bleeding.Will discuss with Dr Sabra Heck.appetite with poor nutrition- will consult dietician. Plan:   as above  CC Referral:  cc: Dr. Verdie Shire / Ebony Cargo FNP (GI)           and           Dr. Regan Rakers   Electronic Signatures: Georges Mouse (MD)  (Signed 07-Jun-15 14:47)  Authored: HISTORY OF PRESENT ILLNESS, PFSH, ROS, NURSING NOTES, PE, ALLERGIES, LABS, OTHER RESULTS, ASSESSMENT AND PLAN, CC Referring Physician   Last Updated: 07-Jun-15 14:47 by Georges Mouse (MD)

## 2014-12-27 NOTE — H&P (Signed)
PATIENT NAME:  Kathy Wagner, Kathy Wagner MR#:  [id number] DATE OF BIRTH:  Jan 12, 1951  DATE OF ADMISSION:  02/14/2014  PRIMARY CARE PHYSICIAN: None.   PRIMARY ONCOLOGIST: Dr. Leia Alf and Dr. Jacquelyne Balint  CHIEF COMPLAINT: Weakness and vaginal bleeding.  HISTORY OF PRESENT ILLNESS:  A 64 year old female with diagnosis of endometrial cancer, who is followed by Dr. Ma Hillock and Dr. Jacquelyne Balint. She had diagnostic laparoscopy under anesthesia in May, and found having endometrial cancer. Started on chemotherapy. She had vaginal bleed and admitted on June 6, and discharged after 3 days. After discharge, 2 days ago, she went to [facility] and had her second round of chemotherapy. Then yesterday she had a radiological scan to see the extent of the disease, and radiologist Dr. Cheron Schaumann at the [facility] was planning to start radiation therapy from next Monday. As per the patient, the plan is to shrink the cancer with radiation and chemo, and after that finally go for the surgery. Meanwhile, this morning, she started having vaginal bleeding and clots again, and feeling very dizzy, so decided to come to Emergency Room. Given to hospitalist service for further management for her symptomatic anemia and medical management of this issue.   REVIEW OF SYSTEMS:   CONSTITUTIONAL: Negative for fever. Positive for fatigue and generalized weakness. No pain, weight loss or weight gain.  EYES: No blurring or double vision, discharge or redness.  EARS, NOSE, THROAT: No tinnitus, ear pain or hearing loss.  RESPIRATORY: No cough, wheezing, hemoptysis, or shortness of breath.  CARDIOVASCULAR: No chest pain, orthopnea, edema, arrhythmia or palpitations.  GASTROINTESTINAL: No nausea, vomiting, diarrhea, abdominal pain.  GENITOURINARY: No dysuria, hematuria, but had clots from vagina. ENDOCRINE: No increased sweating. No heat or cold intolerance.  SKIN: No acne, rashes, or lesions.  MUSCULOSKELETAL: No pain or  swelling in the joints.  NEUROLOGICAL: No numbness, weakness, tremor or vertigo.  PSYCHIATRIC: No anxiety, insomnia, bipolar disorder.   PAST MEDICAL HISTORY: 1.  Endometrial carcinoma.  2.  History of iron deficiency anemia.  3.  Gastroesophageal reflux disease. 4.  Anxiety related to her cancer.    PAST SURGICAL HISTORY: Status post surgery and ruptured ovarian cyst in 1973 and 1992.   SOCIAL HISTORY: No smoking, no drinking, no drugs.   FAMILY HISTORY: Positive for hypertension.   HOME MEDICATIONS: 1.  Venlafaxine 37.5 mg once a day.  2.  Promethazine 12.5 mg oral, take 1/2 tablet every 6 hours as needed for nausea.  3.  Prilosec 20 mg over-the-counter once a day.  4.  Megestrol 40 mg oral 2 times a day.  5.  Lorazepam 2 mg oral 1/4 tablet 3 times a day as needed for anxiety.    before chemotherapy and 2 tablets in the morning after chemotherapy.   VITAL SIGNS: In ER, temperature 98.7, pulse 84, respirations 20, blood pressure 148/69, and pulse ox is 100% on room air.   PHYSICAL EXAMINATION: GENERAL: The patient is fully alert and oriented to time, place, and person. Does not appear in any acute distress.  HEENT: Head and neck atraumatic. Conjunctivae pale. Oral mucosa moist.  NECK: Supple. No JVD.  RESPIRATORY: Bilateral equal and clear air entry.  CARDIOVASCULAR: S1, S2 present. Regular. No murmur.  ABDOMEN: Soft, nontender. Bowel sounds present. There is some mass in suprapubic area present. Bowel sounds normal.  SKIN: No acne, rashes, or lesions.  MUSCULOSKELETAL: No pain or swelling in the joints.  NEUROLOGICAL: No numbness, weakness, tremor or vertigo.  PSYCHIATRIC: No anxiety,  insomnia, bipolar disorder. Does not appear in any acute psychiatric illness at this time.  NEUROLOGICAL: Power 5/5. No tremor or rigidity. Follows commands.  JOINTS:  No swelling or tenderness.  GENITAL EXAMINATION: Done by ER doctor, Dr. Robet Leu, and mentioned to me there are clots present,  activity coming from vagina.   IMPORTANT LAB RESULTS: WBC 8.9, hemoglobin 8.1, which was 8.9 on June 10, platelet count 362, and MCV is 80. Glucose 96, creatinine 0.88, sodium 139, potassium 3.4, chloride is 106, CO2 is 24, calcium is 8.5.   ASSESSMENT AND PLAN: A 64 year old female with past medical history of endometrial cancer, iron deficiency anemia, gastroesophageal reflux disease, and anxiety related to her cancer, presented to Emergency Room with complaint of vaginal bleed and feeling very dizzy. Hemoglobin is 8.1, did not drop much from her previous hemoglobin 2 days ago.   1.  Symptomatic anemia due to acute blood loss from endometrial cancer. I spoke to Dr. Inez Pilgrim on the phone, who is on-call oncologist. As per him, because it is symptomatic anemia, will need to transfuse 1 unit of blood and then will monitor her hemoglobin serially. If she has acute bleeding, then she might need surgical intervention; but otherwise at this point, we can just monitor her hemoglobin and give transfusion as needed. Meanwhile Dr. Inez Pilgrim will try to get in touch with Dr. Jacquelyne Balint, the Oncology/GYN doctor, who is the patient's primary oncologist, and will discuss with her if there is anything else we need to do here.   2.  Endometrial cancer, as mentioned above. Will continue megestrol, as she was taking at home.   3.  Depression. Will continue venlafaxine.   Total time spent on this admission is 60 minutes.   Plan discussed with the patient, her husband, and Dr. Inez Pilgrim on the phone.    ____________________________ Ceasar Lund. Anselm Jungling, MD vgv:mr D: 02/14/2014 20:45:32 ET T: 02/14/2014 22:54:11 ET JOB#: [id number]  cc: Ceasar Lund. Anselm Jungling, MD, <Dictator> Sandeep R. Ma Hillock, MD  Vaughan Basta MD ELECTRONICALLY SIGNED 02/19/2014 9:08

## 2014-12-27 NOTE — Discharge Summary (Signed)
PATIENT NAME:  Kathy Wagner, Kathy Wagner MR#:  [id number] DATE OF BIRTH:  23-Jan-1951  DATE OF ADMISSION:  02/14/2014 DATE OF DISCHARGE:  02/15/2014  PRIMARY CARE PHYSICIAN: None.   PRIMARY ONCOLOGIST: Dr. Leia Alf.  GYNECOLOGIST: Dr. Sabra Heck.  DATE OF TRANSFER TO [facility]: 02/15/2014.  CHIEF COMPLAINT AT THE TIME OF ADMISSION: Weakness and vaginal bleeding.  ADMITTING DIAGNOSIS: Symptomatic anemia due to acute blood loss from endometrial cancer.  TRANSFER DIAGNOSIS: Symptomatic anemia due to acute blood loss from endometrial cancer requiting radiation treatment as soon as possible as recommended by Dr. Inez Pilgrim, oncologist.  HISTORY OF PRESENT ILLNESS: The patient is a 64 year old female who was diagnosed with endometrial cancer and followed by Dr. Ma Hillock and Dr. Sabra Heck as an outpatient, was admitted to [facility] on June 12th for weakness and acute vaginal bleeding. The patient is admitted to the hospital, and on-call oncologist Dr. Inez Pilgrim was notified. She was given 1 unit of blood transfusion. Please review Dr. Nichola Sizer history and physical for details. Subsequently I was called by Dr. Inez Pilgrim, on-call oncologist, who has recommended me to transfer the patient to tertiary care facility where radiation therapy is available. As per Dr. Marylene Land discussion with Dr. Sabra Heck, the patient needs radiation treatment as soon as possible regarding her endometrial cancer and acute blood loss from vaginal bleeding from endometrial cancer. I have discussed this with the patient, who has agreed to get transferred to [facility] to [facility]. The patient is accepted by on-call hospitalist at [facility], Dr. Roel Cluck. I have notified  Dr. Inez Pilgrim to talk to the on-call oncologist regarding the further plan and management in a.m. The patient is transferred to [facility] to bed number 1430 for radiation treatment as soon as possible as recommended  by Dr. Inez Pilgrim under stable condition.   VITAL SIGNS: Temperature 99.2, pulse 74, respirations 20, blood pressure is 120/72, pulse oximetry 99% on room air.  LABORATORY DATA: The patient's hemoglobin was at 8.1, hematocrit 25.3 prior to the blood transfusion. WBC 8.9, platelet count is 362,000. She is O+, antibody negative. BMP is normal except for a potassium at 3.4.   MEDICATIONS AT THE TIME OF TRANSFER: 1. Effexor 37.5 mg p.o. at bedtime. 2. Megace 40 mg p.o. b.i.d.    DIET:  regular diet.  DVT prophylaxis with SCDs.  CODE STATUS: She is full code.  The diagnosis and plan of care was discussed in detail with the patient. She is aware of the plan.   TOTAL TIME SPENT ON THE DISCHARGE AND COORDINATION OF CARE: 45 minutes.    ____________________________ Nicholes Mango, MD ag:lt D: 02/15/2014 02:36:10 ET T: 02/15/2014 04:46:35 ET JOB#: [id number]  cc: Nicholes Mango, MD, <Dictator> Sandeep R. Ma Hillock, MD Simonne Come Inez Pilgrim, MD Nicholes Mango MD ELECTRONICALLY SIGNED 02/23/2014 0:54

## 2014-12-27 NOTE — Op Note (Signed)
PATIENT NAME:  Kathy Wagner, MADEWELL MR#:  [id number] DATE OF BIRTH:  02-Sep-1951  DATE OF PROCEDURE:  01/28/2014  SURGEON:  Dr. Sabra Heck.   PREOPERATIVE DIAGNOSIS:  Advanced adenocarcinoma of the endometrium.   POSTOPERATIVE DIAGNOSIS:  Advanced adenocarcinoma of the endometrium.   PROCEDURE PERFORMED:  Examination under anesthesia.  Diagnostic laparoscopy.   ESTIMATED BLOOD LOSS:  20 mL.   INDICATION FOR SURGERY:  [name] is a 64 year old patient with a long history of postmenopausal bleeding and most recently was noted to have on biopsy a well-differentiated endometrioid adenocarcinoma.  However, pelvic exam was characterized by significant pain and a CT scan revealed advanced pelvic tumor volume.  Therefore, decision was made to proceed with examination under anesthesia and diagnostic laparoscopy.   FINDINGS AT THE TIME OF SURGERY:  Normal slightly atrophic external genitalia, urethral meatus, urethra, bladder and vagina.  The cervix is slightly open.  No cervical lesion.  The uterus deviated to the left side and not mobile.  A firm mass is felt extending from the uterus to the left pelvic sidewall.  The right pelvic sidewall appears free.  On rectovaginal examination, there is extrinsic compression.   At the time of laparoscopy, significant adhesions were noted between omentum and abdominal wall.  The diaphragm and liver appeared normal.  In the pelvis dense adhesions were noted between uterus, large bowel and some loops of small bowel and between uterus the left pelvic sidewall.  The adnexa could not be seen.  Increased vascularity on the uterine serosa and on the bladder, peritoneum and left pelvic sidewall peritoneum.   OPERATIVE REPORT:  After adequate anesthesia had been obtained, the patient was prepped and draped in ski position.  Pelvic exam was done with the above-mentioned findings.  A Foley catheter was inserted.    Attention was then directed towards the abdomen.  A 1 cm incision  was placed above the umbilicus.  The fascia was identified and transected.  The peritoneum was entered.  Then the blunt trocar was inserted.  Pneumoperitoneum was obtained and the above-mentioned findings were noted.    Under direct vision, a 5 mm trocar was inserted into the left mid abdomen.  Using the Harmonic scalpel, the adhesions between omentum and abdominal wall were lysed as well as the adhesions between colon and pelvic sidewall.  Thus inspection of the pelvis became possible and decision was made that this tumor is not amenable to resection at this time.  Cytology was performed.  No macroscopic tumor was available for biopsy.  Hemostasis was adequate at the end of the procedure.    All trocars were removed under direct vision.  The fascia was closed with an interrupted 0 Vicryl stitch.  The subcutaneous tissue was reapproximated with 3-0 Vicryl and the skin was closed with Dermabond.    The patient tolerated the procedure well and was taken to the recovery room in stable condition.  Postoperative urine was clear.  The catheter was removed.  Pad, sponge, needle, and instrument count were correct x 2.    ____________________________ Weber Cooks, MD bem:ea D: 01/29/2014 18:01:34 ET T: 01/30/2014 03:25:16 ET JOB#: [id number]  cc: Weber Cooks, MD, <Dictator> Weber Cooks MD ELECTRONICALLY SIGNED 02/04/2014 11:09

## 2015-02-11 ENCOUNTER — Inpatient Hospital Stay: Payer: 59 | Attending: Internal Medicine | Admitting: Obstetrics and Gynecology

## 2015-02-11 ENCOUNTER — Encounter: Payer: Self-pay | Admitting: Obstetrics and Gynecology

## 2015-02-11 ENCOUNTER — Encounter (INDEPENDENT_AMBULATORY_CARE_PROVIDER_SITE_OTHER): Payer: Self-pay

## 2015-02-11 VITALS — BP 137/78 | HR 67 | Temp 98.2°F | Resp 18 | Ht 65.5 in | Wt 183.0 lb

## 2015-02-11 DIAGNOSIS — C7931 Secondary malignant neoplasm of brain: Secondary | ICD-10-CM | POA: Diagnosis not present

## 2015-02-11 DIAGNOSIS — Z79899 Other long term (current) drug therapy: Secondary | ICD-10-CM | POA: Diagnosis not present

## 2015-02-11 DIAGNOSIS — Z9221 Personal history of antineoplastic chemotherapy: Secondary | ICD-10-CM | POA: Insufficient documentation

## 2015-02-11 DIAGNOSIS — C541 Malignant neoplasm of endometrium: Secondary | ICD-10-CM

## 2015-02-11 DIAGNOSIS — Z923 Personal history of irradiation: Secondary | ICD-10-CM

## 2015-02-11 NOTE — Progress Notes (Signed)
Gynecologic Oncology Interval Note  Referring Provider: Dr Ma Hillock  Chief Concern: Endometrial cancer   Subjective:  Kathy Wagner is a 64 y.o. woman who presents today for continued management of endometrial cancer.   April 8th she developed headache, gait disturbance and vomiting.  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  Dr Salomon Fick in Neurosurgery saw her and she had resection and gamma knife treatment 01/01/15.   She has no residual neurological symptoms now.    Oncology Treatment History:  Kathy Wagner noticed uterine bleeding in 2012. In 2013 pelvic US revealed a 2.61m endometrium. Concern regarding a malignancy was stated and [facility] She did not keep the appointment for ? reasons. She was again evaluated in 2014 and gyn examination was stongly recommended, but again she did not follow-up.   Because of bleeding and pain she [facility]  in 5/15 with negative HR HPV.  CT scan 01/07/14 showed uterine cancer with extension on the left and enlarged left iliac nodes.  On 01/14/14 cervical bx showed grade 1 endometrioid adenocarcinoma.  ER/PR +.  Jan 28, 2014 she underwent laparoscopy with Dr MSabra Heckthat revealed unresectable uterine cancer.  Biopsy not done.  No carcinomatosis.  She received whole pelvic radiation with Dr CBaruch Goutyin 7/15 (3960cGy in 22 fractions).  [facility] that [facility].  She then received carboplatin/taxol chemotherapy with Dr PMa Hillock(cycle 4 completed on delayed schedule due to neutropenia).   Has had anemia requiring blood and irion transfusions.   CT scan 1/16 showed no evidence of disease, but there was extensive bowel wall thickening in the pelvis.  Last chemotherapy Jan 4th 2016.   PET scan 2/16 showed hypermetabolic activity at the uterine fundus, but nowhere else.  In view of this, it was elected to do completion  surgery.  She underwent TLH/RSO 10/17/14 and there was no evidence of metastatic disease.  The left adnexa had been removed previously.  Final pathology showed residual grade 3 endometrioid endometrial cancer with negative cervix, adnexa and washings.  The tumor was 5 cm and was invading 1.6/2.2 cm into the myometrium.   Problem List: Patient Active Problem List   Diagnosis Date Noted  . Vaginal bleeding 02/15/2014  . Acute blood loss anemia 02/15/2014  . Endometrial cancer 02/15/2014  . Anemia, blood loss 02/15/2014  . Depression with anxiety 02/15/2014  . Anorexia 02/15/2014    Past Medical History: Past Medical History  Diagnosis Date  . Anemia     Past Surgical History: Past Surgical History  Procedure Laterality Date  . Ovarian cyst removal  x 3    Family History: Family History  Problem Relation Age of Onset  . Hypertension Mother   . Hypertension Father     Social History: History   Social History  . Marital Status: Married    Spouse Name: N/A  . Number of Children: N/A  . Years of Education: N/A   Occupational History  . Not on file.   Social History Main Topics  . Smoking status: Never Smoker   . Smokeless tobacco: Not on file  . Alcohol Use: No  . Drug Use: No  . Sexual Activity: No   Other Topics Concern  . Not on file   Social History Narrative  . No narrative on file    Allergies: Allergies  Allergen Reactions  .  Azithromycin Swelling    Throat swells  . Codeine Nausea And Vomiting  . Gluten Meal Other (See Comments)    Cramping & pain  . Hydromorphone Hcl     Other reaction(s): Hallucination Feeling of paranoia  . Oxycodone Nausea And Vomiting  . Soy Allergy Other (See Comments)    Patient states that she does not want soy because it can increase estrogen and make her bleed  . Tramadol Nausea And Vomiting    Current Medications: Current Outpatient Prescriptions  Medication Sig Dispense Refill  . FLUoxetine (PROZAC) 20 MG  capsule Take 20 mg by mouth daily.  5  . sennosides-docusate sodium (SENOKOT-S) 8.6-50 MG tablet Take 1 tablet by mouth at bedtime.    . sodium chloride (OCEAN) 0.65 % SOLN nasal spray Place 1 spray into both nostrils as needed for congestion.    Marland Kitchen dexamethasone (DECADRON) 4 MG tablet Take 8 mg by mouth as directed. Take 2 tablets by mouth the day before and the day of chemo.    Marland Kitchen LORazepam (ATIVAN) 0.5 MG tablet Take 0.5 mg by mouth every 8 (eight) hours as needed for anxiety.    . megestrol (MEGACE) 40 MG tablet Take 1 tablet (40 mg total) by mouth 2 (two) times daily. (Patient not taking: Reported on 02/11/2015) 60 tablet 0  . omeprazole (PRILOSEC) 20 MG capsule Take 20 mg by mouth daily.    Marland Kitchen PRESCRIPTION MEDICATION Chemo    . promethazine (PHENERGAN) 12.5 MG tablet Take 6.25 mg by mouth every 6 (six) hours as needed for nausea or vomiting.     No current facility-administered medications for this visit.      Review of Systems A comprehensive review of systems was negative.  Objective:  BP 137/78 mmHg  Pulse 67  Temp(Src) 98.2 F (36.8 C) (Tympanic)  Resp 18  Ht 5' 5.5" (1.664 m)  Wt 182 lb 15.7 oz (83 kg)  BMI 29.98 kg/m2  ECOG Performance Status: 1 - Symptomatic but completely ambulatory  General appearance: alert, cooperative and appears stated age HEENT:PERRLA, sclera clear, anicteric, neck supple with midline trachea and thyroid without masses Lymph node survey: negative Cardiovascular: normal rate and rhythm, no murmurs Respiratory: clear to A and P Breast exam: not done. Abdomen: soft, no masses or hernias. Non tender. Incisions well healed. Back: inspection of back is normal Extremities: no edema or lesions  Skin exam: normal coloration and turgor, no rashes, no suspicious skin lesions noted. Neurological exam reveals: alert, oriented, normal speech, no focal findings or movement disorder noted.  Pelvic: exam chaperoned by nurse;  Vulva: normal appearing vulva with  no masses, tenderness or lesions; Vagina: normal, cuff well healed; Bimanual normal:  Rectal: confirms.   Assessment:  Kathy Wagner is a 64 y.o. female with grade 3 endometrial cancer s/p pelvic radiation, carbo/taxol chemotherapy, TLH/BSO (2/16).  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    Plan:   Problem List Items Addressed This Visit    Endometrial cancer - Primary     She is well healed from surgery.  We do not recommend any further therapy presently.    A follow up appt. is scheduled with Dr Salomon Fick at [facility] at the end of the month and she will have a repeat brain MRI to assess the results of the cerebellar surgery/RT and look for other new lesions.  Dr. Ma Hillock in Medical Oncology will see her in July and we spoke about perhaps repeating a  PET scan to r/o other metastatic disease, although she had a negative PET except for the uterus in 2/16.      Suggested return to clinic to see me in  4 months. She will contact us in the interval with any concerning symptoms.  All of her questions were answered.   Mellody Drown, MD   CC:  Toy Baker, MD [address] [place], [place] [address] [phone]

## 2015-03-16 ENCOUNTER — Other Ambulatory Visit: Payer: Self-pay

## 2015-03-16 DIAGNOSIS — C541 Malignant neoplasm of endometrium: Secondary | ICD-10-CM

## 2015-03-17 ENCOUNTER — Inpatient Hospital Stay: Payer: 59 | Attending: Internal Medicine

## 2015-03-17 ENCOUNTER — Inpatient Hospital Stay (HOSPITAL_BASED_OUTPATIENT_CLINIC_OR_DEPARTMENT_OTHER): Payer: 59 | Admitting: Internal Medicine

## 2015-03-17 VITALS — BP 128/77 | HR 74 | Temp 96.3°F | Resp 18 | Ht 65.0 in | Wt 184.7 lb

## 2015-03-17 DIAGNOSIS — Z79899 Other long term (current) drug therapy: Secondary | ICD-10-CM

## 2015-03-17 DIAGNOSIS — C541 Malignant neoplasm of endometrium: Secondary | ICD-10-CM

## 2015-03-17 DIAGNOSIS — Z923 Personal history of irradiation: Secondary | ICD-10-CM | POA: Diagnosis not present

## 2015-03-17 DIAGNOSIS — Z9221 Personal history of antineoplastic chemotherapy: Secondary | ICD-10-CM

## 2015-03-17 DIAGNOSIS — C7931 Secondary malignant neoplasm of brain: Secondary | ICD-10-CM | POA: Diagnosis not present

## 2015-03-17 LAB — CBC
HEMATOCRIT: 30.2 % — AB (ref 35.0–47.0)
Hemoglobin: 9.8 g/dL — ABNORMAL LOW (ref 12.0–16.0)
MCH: 28.4 pg (ref 26.0–34.0)
MCHC: 32.4 g/dL (ref 32.0–36.0)
MCV: 87.7 fL (ref 80.0–100.0)
Platelets: 278 10*3/uL (ref 150–440)
RBC: 3.45 MIL/uL — AB (ref 3.80–5.20)
RDW: 16.5 % — AB (ref 11.5–14.5)
WBC: 4.5 10*3/uL (ref 3.6–11.0)

## 2015-03-17 LAB — CREATININE, SERUM
Creatinine, Ser: 1.02 mg/dL — ABNORMAL HIGH (ref 0.44–1.00)
GFR calc non Af Amer: 57 mL/min — ABNORMAL LOW (ref >60)

## 2015-03-17 LAB — SAMPLE TO BLOOD BANK

## 2015-03-17 LAB — HEPATIC FUNCTION PANEL
ALK PHOS: 68 U/L (ref 38–126)
ALT: 8 U/L — ABNORMAL LOW (ref 14–54)
AST: 16 U/L (ref 15–41)
Albumin: 3.9 g/dL (ref 3.5–5.0)
Bilirubin, Direct: <0.1 mg/dL — ABNORMAL LOW (ref 0.1–0.5)
TOTAL PROTEIN: 8.1 g/dL (ref 6.5–8.1)
Total Bilirubin: 0.4 mg/dL (ref 0.3–1.2)

## 2015-03-17 LAB — IRON AND TIBC
IRON: 38 ug/dL (ref 28–170)
Saturation Ratios: 13 % (ref 10.4–31.8)
TIBC: 284 ug/dL (ref 250–450)
UIBC: 246 ug/dL

## 2015-03-17 LAB — FERRITIN: Ferritin: 61 ng/mL (ref 11–307)

## 2015-03-17 LAB — ABO/RH: ABO/RH(D): O POS

## 2015-03-17 NOTE — Progress Notes (Signed)
Nausea at times and takes promethazine and she feels like it is from decadron. She wants to get off antidepressant because she does not need it anymore.

## 2015-03-18 LAB — CA 125: CA 125: 15.3 U/mL (ref 0.0–38.1)

## 2015-03-19 ENCOUNTER — Encounter
Admission: RE | Admit: 2015-03-19 | Discharge: 2015-03-19 | Disposition: A | Discharge status: Home / Self Care | Payer: 59 | Source: Ambulatory Visit | Attending: Internal Medicine | Admitting: Internal Medicine

## 2015-03-19 DIAGNOSIS — C541 Malignant neoplasm of endometrium: Secondary | ICD-10-CM | POA: Insufficient documentation

## 2015-03-19 DIAGNOSIS — N1330 Unspecified hydronephrosis: Secondary | ICD-10-CM | POA: Diagnosis not present

## 2015-03-19 LAB — GLUCOSE, CAPILLARY: Glucose-Capillary: 90 mg/dL (ref 65–99)

## 2015-03-19 MED ORDER — FLUDEOXYGLUCOSE F - 18 (FDG) INJECTION
10.8200 | Freq: Once | INTRAVENOUS | Status: AC | PRN
  Administered 2015-03-19: 10.82 via INTRAVENOUS

## 2015-03-24 NOTE — Progress Notes (Signed)
Kathy Wagner  [phone] Fax:([phone]) [phone]     ID: Kathy Wagner OB: [birth year]  MR#: [id number]  JHE#:[id number]  Patient Care Team: Toy Baker, MD as PCP - General (Internal Medicine)  CHIEF COMPLAINT/DIAGNOSIS:  1. 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 Status post lap procedure by Farrel Gordon and felt to be unresectable. Recommended pursuing chemotherapy.  CT abdomen/pelvis on 01/07/14 - IMPRESSION:  1. Irregular heterogeneous mass within the endometrial canal, worrisome for endometrial carcinoma. There is extension of this lesion through the myometrium along the left anterolateral aspect of the uterine body with possible extension through the uterine serosa.  Gynecologic follow-up recommended.  2. Enlarged left external iliac lymph nodes as above, worrisome for nodal metastases.  3. Small volume free fluid within the left pelvic cul-de-sac. 4. Sigmoid diverticulosis without acute diverticulitis. 5. Cholelithiasis  6. Tiny pleural-based nodules within the lung bases, incompletely evaluated. Attention on follow-up recommended. 7. 2 cm subcapsular hypodensity within anterior left hepatic lobe. Finding is favored to reflect focal fat deposition. Got palliative weekly Taxol/Carboplatin chemotherapy x 6 cycles (02/05/14 - 09/08/14). 10/17/14 - Total hysterectomy and unilateral salpingo-oophorectomy at [facility]. Surgical pathology report: residual endometrial, FIGO grade 3 endometrioid adenocarcinoma with areas of necrosis is identified forming a mass (5 cm) involving the uterine fundus and invading 1.6 cm of a 2.2 cm myometrium. The cervix, left fallopian tube and left ovary not involved by carcinoma. Additional findings include serosal adhesions, chronic serositis with focal suture granuloma, and changes consistent with therapy effect. Brain metastasis diagnosed node April/May 2016 and underwent gamma knife surgery at [place] to follow there for this issue.  2. Iron deficiency Anemia secondary to iron deficiency, due to abnormal vaginal bleeding. (Hb 6.3 on 12/30/13. On 01/02/14, Colonoscopy showed Diverticulosis in the sigmoid colon otherwise unremarkable, and EGD showed LA grade B reflux esophagitis). Workup showed positive ANA and SSA-Ab.  HISTORY OF PRESENT ILLNESS:  Patient returns for continued oncology follow-up, she was seen a few months ago. She initially underwent surgery on February 12 at [facility], with surgical pathology as reported above. Subsequently patient was diagnosed to have a solitary brain metastasis and has undergone gamma knife surgery at [facility]. She was followed there for a while, and recently had another MRI which showed a second brain lesion near the occipital area, states that she is being considered for second gamma knife surgery procedure. Otherwise doing well, she has occasional lower abdominal/pelvic pain, rates at 2-3/10. She reports mild constipation which she is taking stool softner. She is physically active. No new dyspnea at rest, orthopnea, angina, palpitation, or PND. She denies any obvious bleeding symptoms including vaginal bleeding.    REVIEW OF SYSTEMS:   ROS As in HPI above. In addition, no fever, chills or sweats. No new headaches or focal weakness.  No sore throat, cough, shortness of breath, sputum, hemoptysis or chest pain. No dizziness or palpitation. No abdominal pain, constipation, diarrhea, dysuria or hematuria. No new skin rash or bleeding symptoms. No new paresthesias in extremities. PS ECOG 1   PAST MEDICAL HISTORY: Reviewed. Past Medical History  Diagnosis Date  . Anemia     IDA  . Brain surgery within last 3 months     12/14/14  . Radiation   . Endometrial cancer   . History of radiation therapy   . History of chemotherapy   . Diverticulosis   . GERD (gastroesophageal reflux disease)  Surgery for ruptured ovarian cysts in 1973 and  1992.  On 01/02/14, colonoscopy showed Diverticulosis in the sigmoid colon otherwise unremarkable, and EGD showed LA grade B               reflux esophagitis.  PAST SURGICAL HISTORY: Reviewed. Past Surgical History  Procedure Laterality Date  . Ovarian cyst removal  x 3    BTL, surgery for ovarian cysts in 1970, 1972, 1992  . Total laparoscopic hysterectomy/right salpingoophorectomy    . Tubal ligation      BTL, surgery for ovarian cysts in 1970, 1972, 1992    FAMILY HISTORY: Reviewed. Family History  Problem Relation Age of Onset  . Hypertension Mother   . Hypertension Father     SOCIAL HISTORY: Reviewed. History  Substance Use Topics  . Smoking status: Never Smoker   . Smokeless tobacco: Never Used  . Alcohol Use: No    Allergies  Allergen Reactions  . Azithromycin Swelling    Throat swells  . Codeine Nausea And Vomiting  . Gluten Meal Other (See Comments)    Cramping & pain  . Hydromorphone Hcl     Other reaction(s): Hallucination Feeling of paranoia  . Oxycodone Nausea And Vomiting  . Soy Allergy Other (See Comments)    Patient states that she does not want soy because it can increase estrogen and make her bleed  . Tramadol Nausea And Vomiting    Current Outpatient Prescriptions  Medication Sig Dispense Refill  . dexamethasone (DECADRON) 2 MG tablet Take 2 mg by mouth daily.    Marland Kitchen FLUoxetine (PROZAC) 20 MG capsule Take 20 mg by mouth daily.  5  . HYDROcodone-acetaminophen (NORCO/VICODIN) 5-325 MG per tablet Take 1 tablet by mouth every 6 (six) hours as needed for moderate pain.    . promethazine (PHENERGAN) 12.5 MG tablet Take 6.25 mg by mouth every 6 (six) hours as needed for nausea or vomiting.    . sennosides-docusate sodium (SENOKOT-S) 8.6-50 MG tablet Take 1 tablet by mouth at bedtime.    . sodium chloride (OCEAN) 0.65 % SOLN nasal spray Place 1 spray into both nostrils as needed for congestion.     No current facility-administered medications for this  visit.    PHYSICAL EXAM: Filed Vitals:   03/17/15 1136  BP: 128/77  Pulse: 74  Temp: 96.3 F (35.7 C)  Resp: 18     Body mass index is 30.74 kg/(m^2).    ECOG FS:1 - Symptomatic but completely ambulatory  GENERAL: Alert and oriented and in no acute distress. There is no icterus. HEENT: EOMs intact. Oral exam negative for thrush or lesions. No cervical lymphadenopathy. CVS: S1S2, regular LUNGS: Bilaterally clear to auscultation, no rhonchi. ABDOMEN: Soft, nontender. No hepatomegaly, masses or ascites clinically.  NEURO: grossly nonfocal, cranial nerves are intact. Gait unremarkable. EXTREMITIES: No pedal edema.   LAB RESULTS:    Component Value Date/Time   NA 133* 12/12/2014 1254   NA 138 02/16/2014 0432   K 3.4* 12/12/2014 1254   K 3.9 02/16/2014 0432   CL 96* 12/12/2014 1254   CL 103 02/16/2014 0432   CO2 28 12/12/2014 1254   CO2 21 02/16/2014 0432   GLUCOSE 113* 12/12/2014 1254   GLUCOSE 105* 02/16/2014 0432   BUN 14 12/12/2014 1254   BUN 9 02/16/2014 0432   CREATININE 1.02* 03/17/2015 1053   CREATININE 0.63 12/12/2014 1254   CALCIUM 9.3 12/12/2014 1254   CALCIUM 8.9 02/16/2014 0432   PROT 8.1 03/17/2015 1053  PROT 8.4* 12/12/2014 1254   ALBUMIN 3.9 03/17/2015 1053   ALBUMIN 4.2 12/12/2014 1254   AST 16 03/17/2015 1053   AST 24 12/12/2014 1254   ALT 8* 03/17/2015 1053   ALT 15 12/12/2014 1254   ALKPHOS 68 03/17/2015 1053   ALKPHOS 64 12/12/2014 1254   BILITOT 0.4 03/17/2015 1053   BILITOT <0.1* 12/12/2014 1254   GFRNONAA 57* 03/17/2015 1053   GFRNONAA >60 12/12/2014 1254   GFRAA >60 03/17/2015 1053   GFRAA >60 12/12/2014 1254   Lab Results  Component Value Date   WBC 4.5 03/17/2015   NEUTROABS 5.6 12/12/2014   HGB 9.8* 03/17/2015   HCT 30.2* 03/17/2015   MCV 87.7 03/17/2015   PLT 278 03/17/2015     STUDIES:    ASSESSMENT / PLAN:   1. 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 Status post lap procedure by Farrel Gordon and felt to be unresectable. Recommended pursuing chemotherapy.  Got palliative weekly Taxol/Carboplatin chemotherapy x 6 cycles (02/05/14 - 09/08/14).   10/17/14 - Total hysterectomy and unilateral salpingo-oophorectomy at [facility]. Surgical pathology report: residual endometrial, FIGO grade 3 endometrioid adenocarcinoma with areas of necrosis is identified forming a mass (5 cm) involving the uterine fundus and invading 1.6 cm of a 2.2 cm myometrium. The cervix, left fallopian tube and left ovary not involved by carcinoma. Additional findings include serosal adhesions, chronic serositis with focal suture granuloma, and changes consistent with therapy effect. Brain metastasis diagnosed around April/May 2016 and underwent gamma knife surgery at [place] to follow there for this issue - reviewed labs from today and discussed with patient. Clinically she seems to be doing fairly steady without any progressive neurological symptoms. However, recent repeat MRI brain showed a second brain lesion in the occipital area and patient states that she is being considered for second gamma knife surgery procedure at [facility]. She does have intermittent abdominal/pelvic pains, could be post surgical but given that she is being planned for above procedure it would be important to restage systemic disease to make sure she is not having other progressive new metastasis. We will send labs today for CBC, metabolic panel, HD-622 level and schedule PET scan for restaging malignancy. If PET scan shows any residual/recurrent activity in the pelvic area, we will discuss with gynecologic oncologist [place] and also forward report to [facility]. If she does not have definite systemic progression of disease, then she will likely benefit the most from above procedure. Patient states that she is closely followed by [facility] at this time, will see her back in 12 weeks with repeat labs. 2. Iron deficiency Anemia  secondary to iron deficiency (Hb 6.3 on 12/30/13. On 01/02/14, Colonoscopy showed Diverticulosis in the sigmoid colon otherwise unremarkable, and EGD showed LA grade B reflux esophagitis). Workup showed positive ANA and SSA-Ab - patient advised to continue taking oral iron at least one tablet daily, continue to monitor. 3. Pain - under control.     4. In between visits, the patient has been advised to call or come to the ER in case of fevers, bleeding, acute sickness, or new symptoms. Patient and husband present are agreeable to this plan.   Leia Alf, MD   03/24/2015 2:49 PM

## 2015-04-22 ENCOUNTER — Telehealth: Payer: Self-pay | Admitting: *Deleted

## 2015-04-22 ENCOUNTER — Inpatient Hospital Stay: Payer: 59 | Admitting: Internal Medicine

## 2015-04-22 VITALS — BP 131/75 | HR 73 | Temp 98.6°F | Resp 18 | Ht 65.0 in | Wt 188.7 lb

## 2015-04-22 NOTE — Progress Notes (Signed)
Patient is here for follow-up. She states that since she last saw Dr. Ma Hillock, so has had another gamma knife treatment and a MRI.

## 2015-04-22 NOTE — Progress Notes (Signed)
Patient has completed second gamma knife procedure for brain metastasis at [facility]. Recent PET scan had showed residual hypermetabolic left pelvic sidewall mass, and per d/w gynecologic oncologist Dr. Fransisca Connors, recommends pursuing CT-guided biopsy of this mass to see if she has residual malignancy to make further plan of management for systemic disease.  Will request the biopsy for next week (have d/w with radiologist Dr. Kathlene Cote) and see her back on August 29 to make further plan of management.

## 2015-04-22 NOTE — Telephone Encounter (Signed)
Pt her in office and needed refill of prozac and dr Ma Hillock gave permission to refill and I called in3 refills per MD ok to her pharmacy and I let pt know it would be called in

## 2015-04-24 ENCOUNTER — Other Ambulatory Visit: Payer: Self-pay | Admitting: Physician Assistant

## 2015-04-27 ENCOUNTER — Telehealth: Payer: Self-pay | Admitting: *Deleted

## 2015-04-27 ENCOUNTER — Other Ambulatory Visit: Payer: Self-pay | Admitting: Internal Medicine

## 2015-04-27 ENCOUNTER — Ambulatory Visit
Admission: RE | Admit: 2015-04-27 | Discharge: 2015-04-27 | Disposition: A | Discharge status: Home / Self Care | Payer: 59 | Source: Ambulatory Visit | Attending: Internal Medicine | Admitting: Internal Medicine

## 2015-04-27 DIAGNOSIS — M25551 Pain in right hip: Secondary | ICD-10-CM | POA: Diagnosis not present

## 2015-04-27 DIAGNOSIS — Z8542 Personal history of malignant neoplasm of other parts of uterus: Secondary | ICD-10-CM | POA: Insufficient documentation

## 2015-04-27 DIAGNOSIS — C541 Malignant neoplasm of endometrium: Secondary | ICD-10-CM

## 2015-04-27 DIAGNOSIS — Z90710 Acquired absence of both cervix and uterus: Secondary | ICD-10-CM | POA: Diagnosis not present

## 2015-04-27 DIAGNOSIS — R948 Abnormal results of function studies of other organs and systems: Secondary | ICD-10-CM

## 2015-04-27 DIAGNOSIS — K219 Gastro-esophageal reflux disease without esophagitis: Secondary | ICD-10-CM | POA: Diagnosis not present

## 2015-04-27 DIAGNOSIS — K5790 Diverticulosis of intestine, part unspecified, without perforation or abscess without bleeding: Secondary | ICD-10-CM | POA: Insufficient documentation

## 2015-04-27 DIAGNOSIS — M25552 Pain in left hip: Secondary | ICD-10-CM | POA: Insufficient documentation

## 2015-04-27 LAB — BASIC METABOLIC PANEL
Anion gap: 8 (ref 5–15)
BUN: 13 mg/dL (ref 6–20)
CHLORIDE: 98 mmol/L — AB (ref 101–111)
CO2: 28 mmol/L (ref 22–32)
CREATININE: 1.12 mg/dL — AB (ref 0.44–1.00)
Calcium: 8.9 mg/dL (ref 8.9–10.3)
GFR calc Af Amer: 59 mL/min — ABNORMAL LOW (ref >60)
GFR calc non Af Amer: 51 mL/min — ABNORMAL LOW (ref >60)
GLUCOSE: 112 mg/dL — AB (ref 65–99)
Potassium: 3.9 mmol/L (ref 3.5–5.1)
SODIUM: 134 mmol/L — AB (ref 135–145)

## 2015-04-27 LAB — CBC
HCT: 27.7 % — ABNORMAL LOW (ref 35.0–47.0)
Hemoglobin: 9.1 g/dL — ABNORMAL LOW (ref 12.0–16.0)
MCH: 28.2 pg (ref 26.0–34.0)
MCHC: 32.6 g/dL (ref 32.0–36.0)
MCV: 86.5 fL (ref 80.0–100.0)
PLATELETS: 256 10*3/uL (ref 150–440)
RBC: 3.21 MIL/uL — ABNORMAL LOW (ref 3.80–5.20)
RDW: 16.1 % — AB (ref 11.5–14.5)
WBC: 4.6 10*3/uL (ref 3.6–11.0)

## 2015-04-27 LAB — PROTIME-INR
INR: 0.98
Prothrombin Time: 13.2 seconds (ref 11.4–15.0)

## 2015-04-27 LAB — APTT: APTT: 33 s (ref 24–36)

## 2015-04-27 MED ORDER — SODIUM CHLORIDE 0.9 % IV SOLN
INTRAVENOUS | Status: DC
  Administered 2015-04-27: 13:00:00 via INTRAVENOUS

## 2015-04-27 MED ORDER — FENTANYL CITRATE (PF) 100 MCG/2ML IJ SOLN
INTRAMUSCULAR | Status: AC
  Filled 2015-04-27: qty 2

## 2015-04-27 MED ORDER — MIDAZOLAM HCL 5 MG/5ML IJ SOLN
INTRAMUSCULAR | Status: AC
  Filled 2015-04-27: qty 5

## 2015-04-27 NOTE — OR Nursing (Signed)
Dr Pascal Lux in to speak to patient, procedure cancelled.

## 2015-04-27 NOTE — Consult Note (Signed)
Chief Complaint: Patient was seen in consultation today for attempted CT-guided left pelvic mass biopsy at the request of Pandit,Sandeep  Referring Physician(s): Pandit,Sandeep  History of Present Illness: Kathy Wagner is a 64 y.o. female with past medical history significant for endometrial cancer, now post hysterectomy with surveillance PET/CT scanning demonstrating indeterminate amorphous hypermetabolic tissue within the left hemipelvis. Patient presents today for potential CT-guided pelvic mass biopsy. The patient is accompanied by her husband though serves as her own historian.  Since last being seen by Dr. Ma Hillock, the patient denies any change in her health status. She reports persistent bilateral hip pain, right GERD left. She continues to complain of urinary urgency and intermittent constipation which she attributes to her previous steroid usage. No fever or chills.  Past Medical History  Diagnosis Date  . Anemia     IDA  . Brain surgery within last 3 months     12/14/14  . Radiation   . Endometrial cancer   . History of radiation therapy   . History of chemotherapy   . Diverticulosis   . GERD (gastroesophageal reflux disease)     Past Surgical History  Procedure Laterality Date  . Ovarian cyst removal  x 3    BTL, surgery for ovarian cysts in 1970, 1972, 1992  . Total laparoscopic hysterectomy/right salpingoophorectomy    . Tubal ligation      BTL, surgery for ovarian cysts in 1970, 1972, 1992  . Brain surgery    . Abdominal hysterectomy      Allergies: Azithromycin; Codeine; Gluten meal; Hydromorphone hcl; Lorazepam; Oxycodone; Soy allergy; and Tramadol  Medications: Prior to Admission medications   Medication Sig Start Date End Date Taking? Authorizing Provider  dexamethasone (DECADRON) 2 MG tablet Take 2 mg by mouth daily.   Yes Historical Provider, MD  docusate sodium (COLACE) 100 MG capsule Take 100 mg by mouth daily as needed for mild  constipation.   Yes Historical Provider, MD  FLUoxetine (PROZAC) 20 MG capsule Take 20 mg by mouth daily. 01/17/15  Yes Historical Provider, MD  HYDROcodone-acetaminophen (NORCO/VICODIN) 5-325 MG per tablet Take 1 tablet by mouth every 6 (six) hours as needed for moderate pain.   Yes Historical Provider, MD  omeprazole (PRILOSEC) 20 MG capsule Take 20 mg by mouth daily.   Yes Historical Provider, MD  sennosides-docusate sodium (SENOKOT-S) 8.6-50 MG tablet Take 1 tablet by mouth at bedtime.   Yes Historical Provider, MD  sodium chloride (OCEAN) 0.65 % SOLN nasal spray Place 1 spray into both nostrils as needed for congestion.   Yes Historical Provider, MD  promethazine (PHENERGAN) 12.5 MG tablet Take 6.25 mg by mouth every 6 (six) hours as needed for nausea or vomiting.    Historical Provider, MD     Family History  Problem Relation Age of Onset  . Hypertension Mother   . Hypertension Father     Social History   Social History  . Marital Status: Married    Spouse Name: N/A  . Number of Children: N/A  . Years of Education: N/A   Social History Main Topics  . Smoking status: Never Smoker   . Smokeless tobacco: Never Used  . Alcohol Use: No  . Drug Use: No  . Sexual Activity: No   Other Topics Concern  . None   Social History Narrative    ECOG Status: 2 - Symptomatic, <50% confined to bed  Review of Systems: A 12 point ROS discussed and pertinent positives are  indicated in the HPI above.  All other systems are negative.  Review of Systems  Constitutional: Negative for fever.  Respiratory: Negative.   Cardiovascular: Negative.   Gastrointestinal: Positive for constipation.  Genitourinary: Positive for frequency.  Psychiatric/Behavioral: Negative.     Vital Signs: BP 118/61 mmHg  Pulse 70  Resp 16  SpO2 100%  Physical Exam  Constitutional: She appears well-developed and well-nourished.  Cardiovascular: Normal rate and regular rhythm.   Pulmonary/Chest: Effort  normal and breath sounds normal.  Psychiatric: She has a normal mood and affect. Her behavior is normal. Judgment and thought content normal.  Nursing note and vitals reviewed.   Mallampati Score:     Imaging: Ct Pelvis W Contrast  04/27/2015   CLINICAL DATA:  History of endometrial cancer, now with indeterminate asymmetric hypermetabolic activity involving the the left hemipelvis worrisome for potential local metastatic disease recurrence. Please perform contrast-enhanced pelvic CT followed by CT-guided biopsy for tissue diagnostic purposes.  EXAM: CT PELVIS WITH CONTRAST  TECHNIQUE: Multidetector CT imaging of the pelvis was performed using the standard protocol following the bolus administration of intravenous contrast.  CONTRAST:  50 cc Omnipaque 300  COMPARISON:  PET-CT - 03/19/2015.  FINDINGS: There is persistent ill-defined asymmetric soft tissue involving the left hemipelvis correlating with the area of abnormal metabolic activity seen on preceding PET-CT though again a discrete definable mass is not identified despite the administration of intravenous contrast.  The amorphous soft tissue measures approximately 1.2 x 1.7 x 2.1 cm (as measured in greatest oblique axial - image 25, series 2 and coronal - image 26, series 3 dimensions respectively), though again, exact measurements are difficult secondary to the ill-defined nature of this asymmetric soft tissue. Additionally, this soft tissue is inseparable from the adjacent left external iliac vein as well as the distal aspect of the left ureter. A minimal amount of tethering is noted adjacent to the asymmetric soft tissue (image 25, series 2).  Stable sequela of prior hysterectomy. No discrete adnexal or ovarian lesions. There is persistent ill-defined soft tissue stranding within the presacral space. No definable/drainable fluid collection.  There is persistent mild ureterectasis involving the imaged distal aspect of the left ureter, similar to  prior PET-CT performed 03/19/2015.  Scattered colonic diverticulosis without definite evidence of diverticulitis.  Scattered atherosclerotic plaque within a normal caliber abdominal aorta.  Normal appearance of the urinary bladder given degree distention.  There is diffuse body wall anasarca ow with soft tissue stranding about the midline of the low back.  No definite acute or aggressive osseous abnormalities.  IMPRESSION: 1. Grossly unchanged asymmetric soft tissue involving the left hemipelvis without definable mass despite the administration of intravenous contrast. Again, this infiltrative soft tissue appears inseparable from the adjacent left external iliac vein. These findings were discussed with referring oncologist, Dr. Ma Hillock, and the decision was made to not proceed with CT-guided biopsy at this time. Further evaluation could be performed with contrast-enhanced pelvic MRI or follow-up PET-CT as clinically indicated. 2. Persistent mild ureterectasis involving the imaged distal aspect the left ureter, similar to prior PET-CT performed 03/19/2015.   Electronically Signed   By: Kathy Wagner M.D.   On: 04/27/2015 15:23    Labs:  CBC:  Recent Labs  09/15/14 0934 12/12/14 1254 03/17/15 1053 04/27/15 1217  WBC 7.1 6.6 4.5 4.6  HGB 8.4* 8.5* 9.8* 9.1*  HCT 25.4* 26.9* 30.2* 27.7*  PLT 325 241 278 256    COAGS:  Recent Labs  04/27/15 1217  INR  0.98  APTT 33    BMP:  Recent Labs  08/18/14 0854 09/08/14 0901 09/15/14 0934 12/12/14 1254 03/17/15 1053 04/27/15 1217  NA 137 136  --  133*  --  134*  K 4.3 3.5  --  3.4*  --  3.9  CL 100 98  --  96*  --  98*  CO2 27 29  --  28  --  28  GLUCOSE 88 88  --  113*  --  112*  BUN 19* 16  --  14  --  13  CALCIUM 8.4* 8.4*  --  9.3  --  8.9  CREATININE 0.71 0.90 0.75 0.63 1.02* 1.12*  GFRNONAA >60 >60 >60 >60 57* 51*  GFRAA >60 >60 >60 >60 >60 59*    LIVER FUNCTION TESTS:  Recent Labs  07/21/14 0943 09/08/14 0901 12/12/14 1254  03/17/15 1053  BILITOT 0.3 0.3 <0.1* 0.4  AST 22 14* 24 16  ALT 27 15 15  8*  ALKPHOS 88 67 64 68  PROT 7.3 7.4 8.4* 8.1  ALBUMIN 3.6 3.5 4.2 3.9    TUMOR MARKERS: No results for input(s): AFPTM, CEA, CA199, CHROMGRNA in the last 8760 hours.  Assessment and Plan:  CAMBREIGH DEARING is a 64 y.o. female with past medical history significant for endometrial cancer, now post hysterectomy with surveillance PET/CT scanning demonstrating indeterminate amorphous hypermetabolic tissue within the left hemipelvis. Patient presents today for potential CT-guided pelvic mass biopsy.   Risks and Benefits discussed with the patient including, but not limited to bleeding, infection, damage to adjacent structures or low yield requiring additional tests. All of the patient's questions were answered, patient is agreeable to proceed. Consent signed and in chart.  Preprocedural contrast enhanced pelvic CT demonstrated asymmetric ill-defined amorphous soft tissue within the left hemipelvis, similar to prior PET CT, though a discrete definable mass was not identified. Additionally, this ill-defined amorphous asymmetric soft tissue was inseparable from the adjacent left extrarenal iliac vein as well as the adjacent distal aspect of the distal ureter. These findings were discussed with Dr. Ma Hillock and the decision was made not to proceed with CT-guided biopsy at this time. Potential future imaging strategies included further evaluation with contrast-enhanced pelvic MRI or continued surveillance with follow-up PET/CT in 3 months.  A copy of this report was sent to the requesting provider on this date.  SignedSandi Wagner 04/27/2015, 3:35 PM   I spent a total of 40 Minutes in face to face in clinical consultation, greater than 50% of which was counseling/coordinating care for management for potential CT-guided left pelvic mass biopsy

## 2015-04-27 NOTE — Telephone Encounter (Signed)
Per call from radiology the ct guided bx can't be done, the 2 doctors in the case have discussed pt but pt had ct of pelvis today with contrast and that order will need to be entered because the original order was for ct guided bx and that was unable to be performed.  I spoke to pandit and he said the radiologist here today could not see a mass on the ct and had Dr. Laural Roes to look at the scan and he could not see a mass to bx.  The pt was explained about why bx could not be done and pt has f/u appt in office for pandit next week. Per radiology need new order and pandit gave me verbal permission to order scan that was done today.

## 2015-05-04 ENCOUNTER — Inpatient Hospital Stay: Payer: 59 | Attending: Internal Medicine | Admitting: Internal Medicine

## 2015-05-04 ENCOUNTER — Inpatient Hospital Stay: Payer: 59

## 2015-05-04 ENCOUNTER — Ambulatory Visit
Admission: RE | Admit: 2015-05-04 | Discharge: 2015-05-04 | Disposition: A | Discharge status: Home / Self Care | Payer: 59 | Source: Ambulatory Visit | Attending: Internal Medicine | Admitting: Internal Medicine

## 2015-05-04 VITALS — BP 128/76 | HR 76 | Temp 96.0°F | Resp 18 | Ht 65.0 in | Wt 191.1 lb

## 2015-05-04 DIAGNOSIS — D509 Iron deficiency anemia, unspecified: Secondary | ICD-10-CM | POA: Insufficient documentation

## 2015-05-04 DIAGNOSIS — C541 Malignant neoplasm of endometrium: Secondary | ICD-10-CM

## 2015-05-04 DIAGNOSIS — M7989 Other specified soft tissue disorders: Secondary | ICD-10-CM | POA: Insufficient documentation

## 2015-05-04 DIAGNOSIS — Z79899 Other long term (current) drug therapy: Secondary | ICD-10-CM | POA: Diagnosis not present

## 2015-05-04 LAB — BASIC METABOLIC PANEL
ANION GAP: 9 (ref 5–15)
BUN: 25 mg/dL — ABNORMAL HIGH (ref 6–20)
CALCIUM: 9.3 mg/dL (ref 8.9–10.3)
CO2: 27 mmol/L (ref 22–32)
CREATININE: 1.23 mg/dL — AB (ref 0.44–1.00)
Chloride: 99 mmol/L — ABNORMAL LOW (ref 101–111)
GFR, EST AFRICAN AMERICAN: 53 mL/min — AB (ref >60)
GFR, EST NON AFRICAN AMERICAN: 45 mL/min — AB (ref >60)
Glucose, Bld: 105 mg/dL — ABNORMAL HIGH (ref 65–99)
Potassium: 4 mmol/L (ref 3.5–5.1)
SODIUM: 135 mmol/L (ref 135–145)

## 2015-05-04 LAB — PROTIME-INR
INR: 1.02
Prothrombin Time: 13.6 seconds (ref 11.4–15.0)

## 2015-05-04 LAB — CBC WITH DIFFERENTIAL/PLATELET
BASOS ABS: 0 10*3/uL (ref 0–0.1)
Basophils Relative: 1 %
Eosinophils Absolute: 0.2 10*3/uL (ref 0–0.7)
Eosinophils Relative: 5 %
HEMATOCRIT: 27.2 % — AB (ref 35.0–47.0)
Hemoglobin: 8.8 g/dL — ABNORMAL LOW (ref 12.0–16.0)
LYMPHS ABS: 0.5 10*3/uL — AB (ref 1.0–3.6)
LYMPHS PCT: 11 %
MCH: 28 pg (ref 26.0–34.0)
MCHC: 32.4 g/dL (ref 32.0–36.0)
MCV: 86.4 fL (ref 80.0–100.0)
MONO ABS: 0.6 10*3/uL (ref 0.2–0.9)
Monocytes Relative: 13 %
NEUTROS ABS: 3.2 10*3/uL (ref 1.4–6.5)
Neutrophils Relative %: 70 %
Platelets: 282 10*3/uL (ref 150–440)
RBC: 3.15 MIL/uL — AB (ref 3.80–5.20)
RDW: 16 % — AB (ref 11.5–14.5)
WBC: 4.5 10*3/uL (ref 3.6–11.0)

## 2015-05-04 LAB — URINALYSIS COMPLETE WITH MICROSCOPIC (ARMC ONLY)
BACTERIA UA: NONE SEEN
Bilirubin Urine: NEGATIVE
GLUCOSE, UA: NEGATIVE mg/dL
Hgb urine dipstick: NEGATIVE
KETONES UR: NEGATIVE mg/dL
NITRITE: NEGATIVE
PROTEIN: NEGATIVE mg/dL
Specific Gravity, Urine: 1.024 (ref 1.005–1.030)
pH: 5 (ref 5.0–8.0)

## 2015-05-04 LAB — HEPATIC FUNCTION PANEL
ALBUMIN: 4 g/dL (ref 3.5–5.0)
ALT: 8 U/L — ABNORMAL LOW (ref 14–54)
AST: 16 U/L (ref 15–41)
Alkaline Phosphatase: 73 U/L (ref 38–126)
Bilirubin, Direct: <0.1 mg/dL — ABNORMAL LOW (ref 0.1–0.5)
TOTAL PROTEIN: 8 g/dL (ref 6.5–8.1)
Total Bilirubin: 0.5 mg/dL (ref 0.3–1.2)

## 2015-05-04 LAB — APTT: APTT: 37 s — AB (ref 24–36)

## 2015-05-04 MED ORDER — FUROSEMIDE 20 MG PO TABS: ORAL_TABLET | ORAL | Status: DC

## 2015-05-04 NOTE — Progress Notes (Signed)
Patient is here for follow-up. Patient has a lot of swelling in her left foot,ankle, and leg. She states that it has been like this for at least a week.

## 2015-05-05 ENCOUNTER — Other Ambulatory Visit: Payer: Self-pay | Admitting: *Deleted

## 2015-05-05 DIAGNOSIS — D509 Iron deficiency anemia, unspecified: Secondary | ICD-10-CM

## 2015-05-05 DIAGNOSIS — C541 Malignant neoplasm of endometrium: Secondary | ICD-10-CM

## 2015-05-05 LAB — IRON AND TIBC
IRON: 31 ug/dL (ref 28–170)
Saturation Ratios: 11 % (ref 10.4–31.8)
TIBC: 296 ug/dL (ref 250–450)
UIBC: 265 ug/dL

## 2015-05-05 LAB — FERRITIN: Ferritin: 70 ng/mL (ref 11–307)

## 2015-05-05 LAB — CA 125, SERUM (SERIAL): CA 125: 13.6 U/mL (ref 0.0–38.1)

## 2015-05-06 LAB — URINE CULTURE

## 2015-05-12 ENCOUNTER — Inpatient Hospital Stay: Payer: 59

## 2015-05-12 ENCOUNTER — Inpatient Hospital Stay: Payer: 59 | Attending: Internal Medicine | Admitting: Internal Medicine

## 2015-05-12 VITALS — BP 140/84 | HR 72 | Temp 97.6°F | Resp 18 | Ht 65.0 in | Wt 189.6 lb

## 2015-05-12 DIAGNOSIS — C541 Malignant neoplasm of endometrium: Secondary | ICD-10-CM | POA: Insufficient documentation

## 2015-05-12 DIAGNOSIS — C7931 Secondary malignant neoplasm of brain: Secondary | ICD-10-CM

## 2015-05-12 DIAGNOSIS — R350 Frequency of micturition: Secondary | ICD-10-CM | POA: Diagnosis not present

## 2015-05-12 DIAGNOSIS — M7989 Other specified soft tissue disorders: Secondary | ICD-10-CM | POA: Diagnosis not present

## 2015-05-12 DIAGNOSIS — Z923 Personal history of irradiation: Secondary | ICD-10-CM

## 2015-05-12 DIAGNOSIS — Z9221 Personal history of antineoplastic chemotherapy: Secondary | ICD-10-CM | POA: Diagnosis not present

## 2015-05-12 DIAGNOSIS — D509 Iron deficiency anemia, unspecified: Secondary | ICD-10-CM | POA: Insufficient documentation

## 2015-05-12 DIAGNOSIS — Z79899 Other long term (current) drug therapy: Secondary | ICD-10-CM | POA: Insufficient documentation

## 2015-05-12 LAB — BASIC METABOLIC PANEL
Anion gap: 11 (ref 5–15)
BUN: 20 mg/dL (ref 6–20)
CHLORIDE: 91 mmol/L — AB (ref 101–111)
CO2: 27 mmol/L (ref 22–32)
Calcium: 8.7 mg/dL — ABNORMAL LOW (ref 8.9–10.3)
Creatinine, Ser: 1.22 mg/dL — ABNORMAL HIGH (ref 0.44–1.00)
GFR calc Af Amer: 53 mL/min — ABNORMAL LOW (ref >60)
GFR calc non Af Amer: 46 mL/min — ABNORMAL LOW (ref >60)
GLUCOSE: 126 mg/dL — AB (ref 65–99)
POTASSIUM: 3.6 mmol/L (ref 3.5–5.1)
Sodium: 129 mmol/L — ABNORMAL LOW (ref 135–145)

## 2015-05-12 LAB — CBC WITH DIFFERENTIAL/PLATELET
BASOS PCT: 2 %
Basophils Absolute: 0.1 10*3/uL (ref 0–0.1)
Eosinophils Absolute: 0.1 10*3/uL (ref 0–0.7)
Eosinophils Relative: 1 %
HEMATOCRIT: 27.7 % — AB (ref 35.0–47.0)
Hemoglobin: 9.1 g/dL — ABNORMAL LOW (ref 12.0–16.0)
Lymphocytes Relative: 5 %
Lymphs Abs: 0.3 10*3/uL — ABNORMAL LOW (ref 1.0–3.6)
MCH: 27.8 pg (ref 26.0–34.0)
MCHC: 32.7 g/dL (ref 32.0–36.0)
MCV: 84.8 fL (ref 80.0–100.0)
MONO ABS: 0.5 10*3/uL (ref 0.2–0.9)
MONOS PCT: 7 %
NEUTROS ABS: 6 10*3/uL (ref 1.4–6.5)
Neutrophils Relative %: 85 %
Platelets: 270 10*3/uL (ref 150–440)
RBC: 3.27 MIL/uL — ABNORMAL LOW (ref 3.80–5.20)
RDW: 16 % — AB (ref 11.5–14.5)
WBC: 7 10*3/uL (ref 3.6–11.0)

## 2015-05-12 LAB — SAMPLE TO BLOOD BANK

## 2015-05-12 MED ORDER — CIPROFLOXACIN HCL 500 MG PO TABS: 500.0000 mg | ORAL_TABLET | Freq: Two times a day (BID) | ORAL | Status: AC

## 2015-05-12 NOTE — Progress Notes (Signed)
Pt still having problems with urination. At times just a few drops of urine-she feels like she has cystitis. Also pain in left pelvic area and when she walks soemtimes she has pain in left and right hip area toward back.  Swelling of left leg all the way to her hip. Using lasix and helping some.

## 2015-05-13 ENCOUNTER — Telehealth: Payer: Self-pay | Admitting: *Deleted

## 2015-05-13 MED ORDER — PROMETHAZINE HCL 25 MG PO TABS: 25.0000 mg | ORAL_TABLET | Freq: Four times a day (QID) | ORAL | Status: AC | PRN

## 2015-05-13 NOTE — Progress Notes (Signed)
[facility]  Telephone:([phone]) [phone] [phone]) [phone]     ID: [name] OB: 1951/05/06  MR#: [id number]  FXT#:[id number]  Patient Care Team: Maeola Sarah, MD as PCP - General (Family Medicine)  CHIEF COMPLAINT/DIAGNOSIS:  1. 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 Status post lap procedure by Farrel Gordon and felt to be unresectable. Recommended pursuing chemotherapy.  CT abdomen/pelvis on 01/07/14 - IMPRESSION:  1. Irregular heterogeneous mass within the endometrial canal, worrisome for endometrial carcinoma. There is extension of this lesion through the myometrium along the left anterolateral aspect of the uterine body with possible extension through the uterine serosa.  Gynecologic follow-up recommended.  2. Enlarged left external iliac lymph nodes as above, worrisome for nodal metastases.  3. Small volume free fluid within the left pelvic cul-de-sac. 4. Sigmoid diverticulosis without acute diverticulitis. 5. Cholelithiasis  6. Tiny pleural-based nodules within the lung bases, incompletely evaluated. Attention on follow-up recommended. 7. 2 cm subcapsular hypodensity within anterior left hepatic lobe. Finding is favored to reflect focal fat deposition. Got palliative weekly Taxol/Carboplatin chemotherapy x 6 cycles (02/05/14 - 09/08/14). 10/17/14 - Total hysterectomy and unilateral salpingo-oophorectomy at [facility]. Surgical pathology report: residual endometrial, FIGO grade 3 endometrioid adenocarcinoma with areas of necrosis is identified forming a mass (5 cm) involving the uterine fundus and invading 1.6 cm of a 2.2 cm myometrium. The cervix, left fallopian tube and left ovary not involved by carcinoma. Additional findings include serosal adhesions, chronic serositis with focal suture granuloma, and changes consistent with therapy effect. Brain metastasis diagnosed node April/May 2016 and underwent gamma knife surgery at [facility] to  follow there for this issue.  2. Iron deficiency Anemia secondary to iron deficiency, due to abnormal vaginal bleeding. (Hb 6.3 on 12/30/13. On 01/02/14, Colonoscopy showed Diverticulosis in the sigmoid colon otherwise unremarkable, and EGD showed LA grade B reflux esophagitis). Workup showed positive ANA and SSA-Ab.  HISTORY OF PRESENT ILLNESS:  Patient returns for continued oncology follow-up, she had progressive left lower extremity swelling last week, venous Doppler was negative for DVT. She has taken Lasix once daily as needed, Leg elevated, states that she did notice improvement in the swelling but it is not completely gone. She also has persistent frequency of urination with intermittent mild discomfort, no hematuria.    REVIEW OF SYSTEMS:   ROS As in HPI above. In addition, no fever, chills. No new headaches or focal weakness.  No sore throat, cough, shortness of breath, sputum, hemoptysis or chest pain. No new paresthesias in extremities. PS ECOG 1   PAST MEDICAL HISTORY: Reviewed. Past Medical History  Diagnosis Date  . Anemia     IDA  . Brain surgery within last 3 months     12/14/14  . Radiation   . Endometrial cancer   . History of radiation therapy   . History of chemotherapy   . Diverticulosis   . GERD (gastroesophageal reflux disease)           Surgery for ruptured ovarian cysts in 1973 and 1992.  On 01/02/14, colonoscopy showed Diverticulosis in the sigmoid colon otherwise unremarkable, and EGD showed LA grade B               reflux esophagitis.  PAST SURGICAL HISTORY: Reviewed. Past Surgical History  Procedure Laterality Date  . Ovarian cyst removal  x 3    BTL, surgery for ovarian cysts in 1970, 1972, 1992  . Total laparoscopic hysterectomy/right salpingoophorectomy    . Tubal  ligation      BTL, surgery for ovarian cysts in 1970, 1972, 1992  . Brain surgery    . Abdominal hysterectomy      FAMILY HISTORY: Reviewed. Family History  Problem Relation Age of Onset   . Hypertension Mother   . Hypertension Father     SOCIAL HISTORY: Reviewed. Social History  Substance Use Topics  . Smoking status: Never Smoker   . Smokeless tobacco: Never Used  . Alcohol Use: No    Allergies  Allergen Reactions  . Azithromycin Swelling    Throat swells  . Codeine Nausea And Vomiting  . Gluten Meal Other (See Comments)    Cramping & pain  . Hydromorphone Hcl     Other reaction(s): Hallucination Feeling of paranoia  . Lorazepam Other (See Comments)    Altered mental status  . Oxycodone Nausea And Vomiting  . Soy Allergy Other (See Comments)    Patient states that she does not want soy because it can increase estrogen and make her bleed  . Tramadol Nausea And Vomiting    Current Outpatient Prescriptions  Medication Sig Dispense Refill  . docusate sodium (COLACE) 100 MG capsule Take 100 mg by mouth daily as needed for mild constipation.    Marland Kitchen FLUoxetine (PROZAC) 20 MG capsule Take 20 mg by mouth daily.  5  . furosemide (LASIX) 20 MG tablet Take 1 tablet orally once daily as needed for leg swelling 30 tablet 0  . HYDROcodone-acetaminophen (NORCO/VICODIN) 5-325 MG per tablet Take 1 tablet by mouth every 6 (six) hours as needed for moderate pain.    Marland Kitchen omeprazole (PRILOSEC) 20 MG capsule Take 20 mg by mouth daily.    Marland Kitchen oxymetazoline (AFRIN) 0.05 % nasal spray Place 1 spray into both nostrils 2 (two) times daily.    . promethazine (PHENERGAN) 12.5 MG tablet Take 6.25 mg by mouth every 6 (six) hours as needed for nausea or vomiting.    . sennosides-docusate sodium (SENOKOT-S) 8.6-50 MG tablet Take 1 tablet by mouth at bedtime.    . sodium chloride (OCEAN) 0.65 % SOLN nasal spray Place 1 spray into both nostrils as needed for congestion.    . ciprofloxacin (CIPRO) 500 MG tablet Take 1 tablet (500 mg total) by mouth 2 (two) times daily. 14 tablet 0   No current facility-administered medications for this visit.    PHYSICAL EXAM: Filed Vitals:   05/12/15 1427    BP: 140/84  Pulse: 72  Temp: 97.6 F (36.4 C)  Resp: 18     Body mass index is 31.55 kg/(m^2).    ECOG FS:1 - Symptomatic but completely ambulatory  GENERAL: Alert and oriented and in no acute distress. There is no icterus. LUNGS: Bilaterally clear to auscultation, no rhonchi. ABDOMEN: Soft, nontender. No hepatomegaly, masses clinically.  EXTREMITIES: 2+ LLE edema, overall slightly better, no erythema or calf tenderness.   LAB RESULTS:    Component Value Date/Time   NA 129* 05/12/2015 1307   NA 133* 12/12/2014 1254   K 3.6 05/12/2015 1307   K 3.4* 12/12/2014 1254   CL 91* 05/12/2015 1307   CL 96* 12/12/2014 1254   CO2 27 05/12/2015 1307   CO2 28 12/12/2014 1254   GLUCOSE 126* 05/12/2015 1307   GLUCOSE 113* 12/12/2014 1254   BUN 20 05/12/2015 1307   BUN 14 12/12/2014 1254   CREATININE 1.22* 05/12/2015 1307   CREATININE 0.63 12/12/2014 1254   CALCIUM 8.7* 05/12/2015 1307   CALCIUM 9.3 12/12/2014 1254  PROT 8.0 05/04/2015 1649   PROT 8.4* 12/12/2014 1254   ALBUMIN 4.0 05/04/2015 1649   ALBUMIN 4.2 12/12/2014 1254   AST 16 05/04/2015 1649   AST 24 12/12/2014 1254   ALT 8* 05/04/2015 1649   ALT 15 12/12/2014 1254   ALKPHOS 73 05/04/2015 1649   ALKPHOS 64 12/12/2014 1254   BILITOT 0.5 05/04/2015 1649   BILITOT <0.1* 12/12/2014 1254   GFRNONAA 46* 05/12/2015 1307   GFRNONAA >60 12/12/2014 1254   GFRNONAA >60 09/15/2014 0934   GFRAA 53* 05/12/2015 1307   GFRAA >60 12/12/2014 1254   GFRAA >60 09/15/2014 0934   Lab Results  Component Value Date   WBC 7.0 05/12/2015   NEUTROABS 6.0 05/12/2015   HGB 9.1* 05/12/2015   HCT 27.7* 05/12/2015   MCV 84.8 05/12/2015   PLT 270 05/12/2015     STUDIES: 05/04/15 - serum CA-125 is 13.6 (was 15.3 on 03/17/2015, 16.09 September 2014, 35.09 March 2014)  03/19/15 - PET Scan. IMPRESSION: Status post hysterectomy. Abnormal soft tissue along the left pelvic sidewall with associated hypermetabolism, suspicious for left external iliac  nodal metastasis. Attention on follow-up is suggested. Moderate left hydroureteronephrosis, possibly tethered/involved by the left pelvic sidewall mass. Thick-walled bladder with associated inflammatory changes, correlate for cystitis.  04/27/15 - CT pelvis with contrast.  IMPRESSION: 1. Grossly unchanged asymmetric soft tissue involving the left hemipelvis without definable mass despite the administration of intravenous contrast. Again, this infiltrative soft tissue appears inseparable from the adjacent left external iliac vein. These findings were discussed with referring oncologist, Dr. Ma Hillock, and the decision was made to not proceed with CT-guided biopsy at this time. Further evaluation could be performed with contrast-enhanced pelvic MRI or follow-up PET-CT as clinically indicated.  2. Persistent mild ureterectasis involving the imaged distal aspect the left ureter, similar to prior PET-CT performed 03/19/2015.  05/04/15 - Bilateral lower extremity venous Doppler.  IMPRESSION: No evidence of deep venous thrombosis.    ASSESSMENT / PLAN:   1. 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 Status post lap procedure by Farrel Gordon and felt to be unresectable. Recommended pursuing chemotherapy.  Got palliative weekly Taxol/Carboplatin chemotherapy x 6 cycles (02/05/14 - 09/08/14).   10/17/14 - Total hysterectomy and unilateral salpingo-oophorectomy at [facility]. Surgical pathology report: residual endometrial, FIGO grade 3 endometrioid adenocarcinoma with areas of necrosis is identified forming a mass (5 cm) involving the uterine fundus and invading 1.6 cm of a 2.2 cm myometrium. The cervix, left fallopian tube and left ovary not involved by carcinoma. Additional findings include serosal adhesions, chronic serositis with focal suture granuloma, and changes consistent with therapy effect. Brain metastasis diagnosed around April/May 2016 and underwent gamma knife surgery x 2 times at  [facility], continues to follow there for this issue -  reviewed labs and recent scans and d/w patient and husband present. She recently underwent second gamma knife surgery procedure at [facility]. Currently denies any headaches, imbalance, seizures or falls. Also recent PET scan had reported left pelvic sidewall abnormality, CT-guided biopsy was considered on August 22 but as described and radiology report above there was no definitive lesion to biopsy and continue monitoring was recommended. Patient clinically states that she has intermittent lower abdominal/pelvic pains but it is under better control. Eating well, physically fairly active. She has chronic constipation issues but is doing better at this time. Plan at this time is continued surveillance and schedule for repeat CT scan in about 12 weeks. I have explained that  if there is definite systemic progression of disease, then we can consider biopsy if necessary or pursue further chemotherapy options. Patient also is agreeable to this approach. 2. Iron deficiency Anemia secondary to iron deficiency (Hb 6.3 on 12/30/13. On 01/02/14, Colonoscopy showed Diverticulosis in the sigmoid colon otherwise unremarkable, and EGD showed LA grade B reflux esophagitis). Workup showed positive ANA and SSA-Ab - patient advised to continue taking oral iron at least one tablet daily, continue to monitor. 3. Left lower extremity swelling - venous Doppler negative for DVT. Has shown mild improvement only despite Lasix and leg elevation. Will make vascular surgery referral.     4. Urinary symptoms including urgency and mild discomfort - UA and urine culture last week is negative for UTI. Given persistent symptoms, will empirically treat for UTI with course of Cipro, we will also refer for urology evaluation.     5. In between visits, the patient has been advised to call or come to the ER in case of fevers, bleeding, acute sickness, or new symptoms. Patient and  husband present are agreeable to this plan.   Leia Alf, MD   05/13/2015 1:59 PM

## 2015-05-13 NOTE — Progress Notes (Addendum)
[place]  [phone] [phone]     ID: [name] OB: Oct 09, 1950  MR#: [id number]  JEH#:[id number]  Patient Care Team: Maeola Sarah, MD as PCP - General (Family Medicine)  CHIEF COMPLAINT/DIAGNOSIS:  1. 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 Status post lap procedure by Farrel Gordon and felt to be unresectable. Recommended pursuing chemotherapy.  CT abdomen/pelvis on 01/07/14 - IMPRESSION:  1. Irregular heterogeneous mass within the endometrial canal, worrisome for endometrial carcinoma. There is extension of this lesion through the myometrium along the left anterolateral aspect of the uterine body with possible extension through the uterine serosa.  Gynecologic follow-up recommended.  2. Enlarged left external iliac lymph nodes as above, worrisome for nodal metastases.  3. Small volume free fluid within the left pelvic cul-de-sac. 4. Sigmoid diverticulosis without acute diverticulitis. 5. Cholelithiasis  6. Tiny pleural-based nodules within the lung bases, incompletely evaluated. Attention on follow-up recommended. 7. 2 cm subcapsular hypodensity within anterior left hepatic lobe. Finding is favored to reflect focal fat deposition. Got palliative weekly Taxol/Carboplatin chemotherapy x 6 cycles (02/05/14 - 09/08/14). 10/17/14 - Total hysterectomy and unilateral salpingo-oophorectomy at [facility]. Surgical pathology report: residual endometrial, FIGO grade 3 endometrioid adenocarcinoma with areas of necrosis is identified forming a mass (5 cm) involving the uterine fundus and invading 1.6 cm of a 2.2 cm myometrium. The cervix, left fallopian tube and left ovary not involved by carcinoma. Additional findings include serosal adhesions, chronic serositis with focal suture granuloma, and changes consistent with therapy effect. Brain metastasis diagnosed node April/May 2016 and underwent gamma knife surgery at [place] to  follow there for this issue.  2. Iron deficiency Anemia secondary to iron deficiency, due to abnormal vaginal bleeding. (Hb 6.3 on 12/30/13. On 01/02/14, Colonoscopy showed Diverticulosis in the sigmoid colon otherwise unremarkable, and EGD showed LA grade B reflux esophagitis). Workup showed positive ANA and SSA-Ab.  HISTORY OF PRESENT ILLNESS:  Patient returns for continued oncology follow-up, she was seen a few months ago. She has now undergone a second gamma knife surgery at [facility] for a second isolated brain metastasis. States that she is doing fairly well and denies headaches or other neurological symptoms. Eating better. Main complaint today is progressive left lower extremity swelling, states that she noticed some puffiness in the left dorsum of the foot after she had an IV placed at [facility] recently, and subsequently swelling has ascended all the way up the left thigh. No significant swelling in the right lower extremity otherwise. Denies new dyspnea, cough or chest pain. She denies any obvious bleeding symptoms including vaginal bleeding. Has intermittent abdominal/pelvic pain since prior surgery which is unchanged.    REVIEW OF SYSTEMS:   ROS As in HPI above. In addition, no fever, chills. No new headaches or focal weakness.  No sore throat, cough, shortness of breath, sputum, hemoptysis or chest pain. No dizziness or palpitation. No new skin rash or bleeding symptoms. No new paresthesias in extremities. PS ECOG 1   PAST MEDICAL HISTORY: Reviewed. Past Medical History  Diagnosis Date  . Anemia     IDA  . Brain surgery within last 3 months     12/14/14  . Radiation   . Endometrial cancer   . History of radiation therapy   . History of chemotherapy   . Diverticulosis   . GERD (gastroesophageal reflux disease)           Surgery for ruptured ovarian cysts in 1973 and 1992.  On 01/02/14, colonoscopy showed Diverticulosis in the sigmoid colon otherwise unremarkable, and EGD  showed LA grade B               reflux esophagitis.  PAST SURGICAL HISTORY: Reviewed. Past Surgical History  Procedure Laterality Date  . Ovarian cyst removal  x 3    BTL, surgery for ovarian cysts in 1970, 1972, 1992  . Total laparoscopic hysterectomy/right salpingoophorectomy    . Tubal ligation      BTL, surgery for ovarian cysts in 1970, 1972, 1992  . Brain surgery    . Abdominal hysterectomy      FAMILY HISTORY: Reviewed. Family History  Problem Relation Age of Onset  . Hypertension Mother   . Hypertension Father     SOCIAL HISTORY: Reviewed. Social History  Substance Use Topics  . Smoking status: Never Smoker   . Smokeless tobacco: Never Used  . Alcohol Use: No    Allergies  Allergen Reactions  . Azithromycin Swelling    Throat swells  . Codeine Nausea And Vomiting  . Gluten Meal Other (See Comments)    Cramping & pain  . Hydromorphone Hcl     Other reaction(s): Hallucination Feeling of paranoia  . Lorazepam Other (See Comments)    Altered mental status  . Oxycodone Nausea And Vomiting  . Soy Allergy Other (See Comments)    Patient states that she does not want soy because it can increase estrogen and make her bleed  . Tramadol Nausea And Vomiting    Current Outpatient Prescriptions  Medication Sig Dispense Refill  . docusate sodium (COLACE) 100 MG capsule Take 100 mg by mouth daily as needed for mild constipation.    Marland Kitchen FLUoxetine (PROZAC) 20 MG capsule Take 20 mg by mouth daily.  5  . omeprazole (PRILOSEC) 20 MG capsule Take 20 mg by mouth daily.    . sennosides-docusate sodium (SENOKOT-S) 8.6-50 MG tablet Take 1 tablet by mouth at bedtime.    . sodium chloride (OCEAN) 0.65 % SOLN nasal spray Place 1 spray into both nostrils as needed for congestion.    . ciprofloxacin (CIPRO) 500 MG tablet Take 1 tablet (500 mg total) by mouth 2 (two) times daily. 14 tablet 0  . furosemide (LASIX) 20 MG tablet Take 1 tablet orally once daily as needed for leg swelling 30  tablet 0  . HYDROcodone-acetaminophen (NORCO/VICODIN) 5-325 MG per tablet Take 1 tablet by mouth every 6 (six) hours as needed for moderate pain.    Marland Kitchen oxymetazoline (AFRIN) 0.05 % nasal spray Place 1 spray into both nostrils 2 (two) times daily.    . promethazine (PHENERGAN) 12.5 MG tablet Take 6.25 mg by mouth every 6 (six) hours as needed for nausea or vomiting.     No current facility-administered medications for this visit.    PHYSICAL EXAM: Filed Vitals:   05/04/15 1558  BP: 128/76  Pulse: 76  Temp: 96 F (35.6 C)  Resp: 18     Body mass index is 31.81 kg/(m^2).    ECOG FS:1 - Symptomatic but completely ambulatory  GENERAL: Alert and oriented and in no acute distress. There is no icterus. HEENT: EOMs intact. Oral exam negative for thrush or lesions. No cervical lymphadenopathy. CVS: S1S2, regular LUNGS: Bilaterally clear to auscultation, no rhonchi. ABDOMEN: Soft, nontender. No hepatomegaly, masses or ascites clinically.  EXTREMITIES: 2+ LLE edema, no erythema or calf tenderness.   LAB RESULTS:    Component Value Date/Time   NA 129* 05/12/2015 1307  NA 133* 12/12/2014 1254   K 3.6 05/12/2015 1307   K 3.4* 12/12/2014 1254   CL 91* 05/12/2015 1307   CL 96* 12/12/2014 1254   CO2 27 05/12/2015 1307   CO2 28 12/12/2014 1254   GLUCOSE 126* 05/12/2015 1307   GLUCOSE 113* 12/12/2014 1254   BUN 20 05/12/2015 1307   BUN 14 12/12/2014 1254   CREATININE 1.22* 05/12/2015 1307   CREATININE 0.63 12/12/2014 1254   CALCIUM 8.7* 05/12/2015 1307   CALCIUM 9.3 12/12/2014 1254   PROT 8.0 05/04/2015 1649   PROT 8.4* 12/12/2014 1254   ALBUMIN 4.0 05/04/2015 1649   ALBUMIN 4.2 12/12/2014 1254   AST 16 05/04/2015 1649   AST 24 12/12/2014 1254   ALT 8* 05/04/2015 1649   ALT 15 12/12/2014 1254   ALKPHOS 73 05/04/2015 1649   ALKPHOS 64 12/12/2014 1254   BILITOT 0.5 05/04/2015 1649   BILITOT <0.1* 12/12/2014 1254   GFRNONAA 46* 05/12/2015 1307   GFRNONAA >60 12/12/2014 1254    GFRNONAA >60 09/15/2014 0934   GFRAA 53* 05/12/2015 1307   GFRAA >60 12/12/2014 1254   GFRAA >60 09/15/2014 0934   Lab Results  Component Value Date   WBC 7.0 05/12/2015   NEUTROABS 6.0 05/12/2015   HGB 9.1* 05/12/2015   HCT 27.7* 05/12/2015   MCV 84.8 05/12/2015   PLT 270 05/12/2015     STUDIES: 05/04/15 - serum CA-125 is 13.6 (was 15.3 on 03/17/2015, 16.09 September 2014, 35.09 March 2014)  03/19/15 - PET Scan. IMPRESSION: Status post hysterectomy. Abnormal soft tissue along the left pelvic sidewall with associated hypermetabolism, suspicious for left external iliac nodal metastasis. Attention on follow-up is suggested. Moderate left hydroureteronephrosis, possibly tethered/involved by the left pelvic sidewall mass. Thick-walled bladder with associated inflammatory changes, correlate for cystitis.  04/27/15 - CT pelvis with contrast.  IMPRESSION: 1. Grossly unchanged asymmetric soft tissue involving the left hemipelvis without definable mass despite the administration of intravenous contrast. Again, this infiltrative soft tissue appears inseparable from the adjacent left external iliac vein. These findings were discussed with referring oncologist, Dr. Ma Hillock, and the decision was made to not proceed with CT-guided biopsy at this time. Further evaluation could be performed with contrast-enhanced pelvic MRI or follow-up PET-CT as clinically indicated.  2. Persistent mild ureterectasis involving the imaged distal aspect the left ureter, similar to prior PET-CT performed 03/19/2015.    ASSESSMENT / PLAN:   1. 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 Status post lap procedure by Farrel Gordon and felt to be unresectable. Recommended pursuing chemotherapy.  Got palliative weekly Taxol/Carboplatin chemotherapy x 6 cycles (02/05/14 - 09/08/14).   10/17/14 - Total hysterectomy and unilateral salpingo-oophorectomy at [facility]. Surgical pathology report: residual endometrial,  FIGO grade 3 endometrioid adenocarcinoma with areas of necrosis is identified forming a mass (5 cm) involving the uterine fundus and invading 1.6 cm of a 2.2 cm myometrium. The cervix, left fallopian tube and left ovary not involved by carcinoma. Additional findings include serosal adhesions, chronic serositis with focal suture granuloma, and changes consistent with therapy effect. Brain metastasis diagnosed around April/May 2016 and underwent gamma knife surgery x 2 times at [facility], continues to follow there for this issue -  reviewed labs and recent scans and d/w patient and husband present. She recently underwent second gamma knife surgery procedure at [facility]. Currently denies any headaches, imbalance, seizures or falls. Also recent PET scan had reported left pelvic sidewall abnormality, CT-guided biopsy was considered on August  22 but as described and radiology report above there was no definitive lesion to biopsy and continue monitoring was recommended. Patient clinically states that she has intermittent lower abdominal/pelvic pains but it is under better control. Eating well, physically fairly active. She has chronic constipation issues but is doing better at this time. Plan at this time is continued surveillance and would consider repeat CT scan in about 12 weeks. I have explained that if there is definite systemic progression of disease, then we can consider biopsy if necessary or pursue further chemotherapy options. Patient also is agreeable to this approach. 2. Iron deficiency Anemia secondary to iron deficiency (Hb 6.3 on 12/30/13. On 01/02/14, Colonoscopy showed Diverticulosis in the sigmoid colon otherwise unremarkable, and EGD showed LA grade B reflux esophagitis). Workup showed positive ANA and SSA-Ab - patient advised to continue taking oral iron at least one tablet daily, continue to monitor. 3. Left lower extremity swelling - venous Doppler negative for DVT today. Will try Lasix  when necessary, elevate lower extremity while sitting/resting. Will follow-up next week and if no improvement will consider vascular surgery referral.     4. Urinary symptoms including urgency and mild discomfort - will get UA and urine culture.     5. In between visits, the patient has been advised to call or come to the ER in case of fevers, bleeding, acute sickness, or new symptoms. Patient and husband present are agreeable to this plan.   Leia Alf, MD   05/13/2015 1:40 PM

## 2015-05-13 NOTE — Telephone Encounter (Signed)
Pt called stating that she has been nauseated from being so constipated.  Dr Ma Hillock and her had spoke in clinic about what other meds she can use to help with bowel movement.  She ran out of nausea med and wanted to know if it can be refilled.  Dr. Ma Hillock said yes and it was refilled electronically and pt told that it was sent in electronically to [facility] in [place].

## 2015-05-22 ENCOUNTER — Other Ambulatory Visit: Payer: Self-pay | Admitting: Internal Medicine

## 2015-05-22 DIAGNOSIS — C541 Malignant neoplasm of endometrium: Secondary | ICD-10-CM

## 2015-05-22 MED ORDER — HYDROCODONE-ACETAMINOPHEN 5-325 MG PO TABS: 1.0000 | ORAL_TABLET | Freq: Four times a day (QID) | ORAL | Status: DC | PRN

## 2015-05-27 ENCOUNTER — Inpatient Hospital Stay (HOSPITAL_BASED_OUTPATIENT_CLINIC_OR_DEPARTMENT_OTHER): Payer: 59 | Admitting: Obstetrics and Gynecology

## 2015-05-27 ENCOUNTER — Encounter: Payer: Self-pay | Admitting: Obstetrics and Gynecology

## 2015-05-27 VITALS — BP 154/85 | HR 83 | Temp 96.2°F | Resp 18 | Ht 65.0 in | Wt 186.5 lb

## 2015-05-27 DIAGNOSIS — R600 Localized edema: Secondary | ICD-10-CM | POA: Diagnosis not present

## 2015-05-27 DIAGNOSIS — C541 Malignant neoplasm of endometrium: Secondary | ICD-10-CM

## 2015-05-27 NOTE — Progress Notes (Signed)
Gynecologic Oncology Interval Note  Referring Provider: Dr Ma Hillock  Chief Concern: Endometrial cancer   Subjective:  Kathy Wagner is a 64 y.o. woman who presents today for continued management of metastatic endometrial cancer.   Main complaint is swelling of left lower extremity for past month.  Dopplers negative for clot.  Saw Dr Lucky Cowboy in Vascular Surgery who was considering a surgical intervention to alleviate narrowing of left iliac vein.  She has not seen PT for management of her edema.  Diuretic has not helped. Also has some constipation.  CT scan was done and shows suggestion of mass around left iliac vessels on PSW, but not amenable to bx.  Plan by Dr Ma Hillock was surveillance and schedule repeat CT scan in about 12 weeks.   Oncology Treatment History:  Mrs. Rahrig noticed uterine bleeding in 2012. In 2013 pelvic US revealed a 2.57mm endometrium. Concern regarding a malignancy was stated and the patient was referred to [facility] for biopsy. She did not keep the appointment for ? reasons. She was again evaluated in 2014 and gyn examination was stongly recommended, but again she did not follow-up.   Because of bleeding and pain she returned for medical evaluation at [facility].  ASCUS PAP  in 5/15 with negative HR HPV.  CT scan 01/07/14 showed uterine cancer with extension on the left and enlarged left iliac nodes.  On 01/14/14 cervical bx showed grade 1 endometrioid adenocarcinoma.  ER/PR +.  Jan 28, 2014 she underwent laparoscopy with Dr Sabra Heck that revealed unresectable uterine cancer.  Biopsy not done.  No carcinomatosis.  She received whole pelvic radiation with Dr Baruch Gouty in 7/15 (3960cGy in 22 fractions).  Prior to that she received 2 larger fractions (? 600 CGy) at [facility] to control bleeding.  She then received carboplatin/taxol chemotherapy with Dr Ma Hillock (cycle 4 completed on delayed schedule due to neutropenia).   Has had anemia requiring blood and irion transfusions.   CT scan 1/16 showed  no evidence of disease, but there was extensive bowel wall thickening in the pelvis.  Last chemotherapy Jan 4th 2016.   PET scan 2/16 showed hypermetabolic activity at the uterine fundus, but nowhere else.  In view of this, it was elected to do completion surgery.  She underwent TLH/RSO 10/17/14 and there was no evidence of metastatic disease.  The left adnexa had been removed previously.  Final pathology showed residual grade 3 endometrioid endometrial cancer with negative cervix, adnexa and washings.  The tumor was 5 cm and was invading 1.6/2.2 cm into the myometrium.   April 8th she developed headache, gait disturbance and vomiting.  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  Dr Salomon Fick in Neurosurgery saw her and she had resection and gamma knife treatment 01/01/15. Also had a second gamma knife surgery. She has no residual neurological symptoms now.  PET scan 8/16: Grossly unchanged asymmetric soft tissue involving the left hemipelvis without definable mass despite the administration of intravenous contrast. Again, this infiltrative soft tissue appears inseparable from the adjacent left external iliac vein. CT-guided biopsy was considered, but as described there was no definitive lesion to biopsy and continue monitoring was recommended.   Problem List: Patient Active Problem List   Diagnosis Date Noted  . Vaginal bleeding 02/15/2014  . Acute blood loss anemia 02/15/2014  . Endometrial cancer 02/15/2014  . Anemia, blood loss 02/15/2014  . Depression with anxiety 02/15/2014  . Anorexia 02/15/2014    Past Medical History: Past Medical History  Diagnosis  Date  . Anemia     IDA  . Brain surgery within last 3 months     12/14/14  . Radiation   . Endometrial cancer   . History of radiation therapy   . History of chemotherapy   . Diverticulosis   . GERD (gastroesophageal reflux disease)     Past Surgical History: Past Surgical History  Procedure Laterality Date  . Ovarian cyst  removal  x 3    BTL, surgery for ovarian cysts in 1970, 1972, 1992  . Total laparoscopic hysterectomy/right salpingoophorectomy    . Tubal ligation      BTL, surgery for ovarian cysts in 1970, 1972, 1992  . Brain surgery    . Abdominal hysterectomy      Family History: Family History  Problem Relation Age of Onset  . Hypertension Mother   . Hypertension Father     Social History: Social History   Social History  . Marital Status: Married    Spouse Name: N/A  . Number of Children: N/A  . Years of Education: N/A   Occupational History  . Not on file.   Social History Main Topics  . Smoking status: Never Smoker   . Smokeless tobacco: Never Used  . Alcohol Use: No  . Drug Use: No  . Sexual Activity: No   Other Topics Concern  . Not on file   Social History Narrative    Allergies: Allergies  Allergen Reactions  . Azithromycin Swelling    Throat swells  . Codeine Nausea And Vomiting  . Gluten Meal Other (See Comments)    Cramping & pain  . Hydromorphone Hcl     Other reaction(s): Hallucination Feeling of paranoia  . Lorazepam Other (See Comments)    Altered mental status  . Oxycodone Nausea And Vomiting  . Potassium Sorbate Other (See Comments)  . Soy Allergy Other (See Comments)    Patient states that she does not want soy because it can increase estrogen and make her bleed  . Tramadol Nausea And Vomiting    Current Medications: Current Outpatient Prescriptions  Medication Sig Dispense Refill  . docusate sodium (COLACE) 100 MG capsule Take 100 mg by mouth daily as needed for mild constipation.    Marland Kitchen FLUoxetine (PROZAC) 20 MG capsule Take 20 mg by mouth daily.  5  . HYDROcodone-acetaminophen (NORCO/VICODIN) 5-325 MG per tablet Take 1 tablet by mouth every 6 (six) hours as needed for moderate pain. 60 tablet 0  . omeprazole (PRILOSEC) 20 MG capsule Take 20 mg by mouth daily.    Marland Kitchen oxymetazoline (AFRIN) 0.05 % nasal spray Place 1 spray into both nostrils 2  (two) times daily.    . promethazine (PHENERGAN) 25 MG tablet Take 1 tablet (25 mg total) by mouth every 6 (six) hours as needed for nausea or vomiting. 60 tablet 0  . sennosides-docusate sodium (SENOKOT-S) 8.6-50 MG tablet Take 1 tablet by mouth at bedtime.    . sodium chloride (OCEAN) 0.65 % SOLN nasal spray Place 1 spray into both nostrils as needed for congestion.    . ciprofloxacin (CIPRO) 500 MG tablet Take 1 tablet (500 mg total) by mouth 2 (two) times daily. (Patient not taking: Reported on 05/27/2015) 14 tablet 0  . furosemide (LASIX) 20 MG tablet Take 1 tablet orally once daily as needed for leg swelling (Patient not taking: Reported on 05/27/2015) 30 tablet 0   No current facility-administered medications for this visit.      Review of Systems A  comprehensive review of systems was negative.  Objective:  BP 154/85 mmHg  Pulse 83  Temp(Src) 96.2 F (35.7 C) (Tympanic)  Resp 18  Ht 5\' 5"  (1.651 m)  Wt 186 lb 8.2 oz (84.6 kg)  BMI 31.04 kg/m2  ECOG Performance Status: 1 - Symptomatic but completely ambulatory  General appearance: alert, cooperative and appears stated age HEENT:PERRLA, sclera clear, anicteric, neck supple with midline trachea and thyroid without masses Lymph node survey: negative Cardiovascular: normal rate and rhythm, no murmurs Respiratory: clear to A and P Breast exam: not done. Abdomen: soft, no masses or hernias. Non tender. Incisions well healed. Back: inspection of back is normal Extremities: right normal.  Left 3+ edema all the way up the leg, no cyanosis, pain or cellulitis.    Skin exam: normal coloration and turgor, no rashes, no suspicious skin lesions noted. Neurological exam reveals: alert, oriented, normal speech, no focal findings or movement disorder noted.  Pelvic: exam chaperoned by nurse;  Vulva: normal appearing vulva with no masses, tenderness or lesions; Vagina: normal, cuff well healed; Bimanual normal:  Rectal: confirms.  No masses  palpable.   Assessment:  OLIVIYA GILKISON is a 64 y.o. female with grade 3 endometrial cancer s/p pelvic radiation, carbo/taxol chemotherapy (finished 1/16), TLH/BSO (2/16), brain mets treated with surgery and gamma knife (4/16).   Now with left leg swelling, likely due to regrowth of cancer on pelvic sidewall, but not amenable to CT directed bx.     Plan:   Problem List Items Addressed This Visit      Genitourinary   Endometrial cancer - Primary   Relevant Orders   Ambulatory referral to Physical Therapy   CT Abdomen Pelvis W Contrast     Other   Edema of left lower extremity     I had a long discussion with the patient and her husband about options for management.  First, I reviewed her CT and PET with [facility] interventional radiology and they agreed that the left PSW is not amenable to CT directed bx.  We could start chemotherapy now with Doxil in view of PET positivity there, but the likelihood of dramatic response is low (about 20%).  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  I spoke to Dr Lucky Cowboy in Vascular Surgery and he agreed that surgical intervention is probably not likely to provide more than transient relief if this is due to recurrence of the cancer.  We will have her take low dose ASA to try to prevent the development of a DVT.  She does not have this now, but is at high risk to develop a DVT given the compression of the iliac vein and lymphatics.    She will have a repeat CT 11/15 and will follow up with Dr Ma Hillock that day and will see me the following day.  If the CT scan shows progression of the mass, we can reconsider biopsy and chemotherapy.  We discussed  That second line chemotherapy for endometrial cancer is not very effective though, and radiation and surgery are not an option at this point.    She will contact us in the interval with any new concerning  symptoms.  All of her questions were answered.   Mellody Drown, MD  CC:  Toy Baker, MD [address], [place], [place] [address] [phone]

## 2015-05-27 NOTE — Progress Notes (Signed)
Assisted physician with pelvic exam. 

## 2015-05-28 ENCOUNTER — Encounter: Payer: Self-pay | Admitting: Occupational Therapy

## 2015-05-28 ENCOUNTER — Ambulatory Visit: Payer: 59 | Attending: Family Medicine | Admitting: Occupational Therapy

## 2015-05-28 DIAGNOSIS — I890 Lymphedema, not elsewhere classified: Secondary | ICD-10-CM | POA: Diagnosis not present

## 2015-05-28 NOTE — Therapy (Signed)
[place] MAIN [facility] SERVICES [address] [place], [place], [address] Phone: [phone]   Fax:  [phone]  Occupational Therapy Treatment  Patient Details  Name: Kathy Wagner MRN: [id number] Date of Birth: [birth year] Referring Kayla Weekes:  Mellody Drown, MD  Encounter Date: 05/28/2015      OT End of Session - 05/28/15 1704    Visit Number 1   Number of Visits 36   Date for OT Re-Evaluation 08/26/15   OT Start Time 1100   OT Stop Time 1230   OT Time Calculation (min) 90 min   Activity Tolerance Patient tolerated treatment well;Patient limited by pain   Behavior During Therapy [facility] for tasks assessed/performed      Past Medical History  Diagnosis Date  . Anemia     IDA  . Brain surgery within last 3 months     12/14/14  . Radiation   . Endometrial cancer   . History of radiation therapy   . History of chemotherapy   . Diverticulosis   . GERD (gastroesophageal reflux disease)     Past Surgical History  Procedure Laterality Date  . Ovarian cyst removal  x 3    BTL, surgery for ovarian cysts in 1970, 1972, 1992  . Total laparoscopic hysterectomy/right salpingoophorectomy    . Tubal ligation      BTL, surgery for ovarian cysts in 1970, 1972, 1992  . Brain surgery    . Abdominal hysterectomy      There were no vitals filed for this visit.  Visit Diagnosis:  Lymphedema - Plan: Ot plan of care cert/re-cert      Subjective Assessment - 05/28/15 1640    Subjective  Pt is referred to OT for evaluation and treatment of LLE/LLQ lymphedema 2/2 to treatment for endometrial cancer including surgery, chemotherapy and XRT. Pt first noted dense abdominal swelling soon after initial surgery. She noticed onset of painful RLE swelling about 4 weeks ago s/p subsequent surgery. She reports L foot and leg were swollen and bruising on dorsum of L foot where IV was plaiced and remained after procedure for >24 hrs. Pt reports swelling has  persisted and worsened since initial onset  and no longer resolves.   Patient is accompained by: Family member   Pertinent History 12/14/14 Brain surgery to remove metastatic mass; Endometrial cancer dx 2015; surgical excision 10/2014, lymph node disection? (Op note not available), chemotherapy and XRT. total laproscopic hysterectomy & R salpingoophorectomy   Limitations pain   Patient Stated Goals reduce leg swelling and keep it from getting worse; reduce swelling and L leg and abdominal pain   Currently in Pain? Yes   Pain Score 6    Pain Location Leg   Pain Orientation Left   Pain Descriptors / Indicators Constant;Heaviness;Pressure;Sore;Tender;Tightness;Aching;Discomfort;Grimacing   Pain Type Chronic pain   Pain Onset More than a month ago   Pain Frequency Constant   Aggravating Factors  standing, walking, sitting, supine positioning, sidelying   Pain Relieving Factors medication reduces intensity   Effect of Pain on Daily Activities LLE pain and swelling limits ability to sleep, perform basic and instrumental ADLs, perform productive and leisure activities, and participate in family, social and community activities            [facility] OT Assessment - 05/28/15 0001    Assessment   Diagnosis mild, stage 1-2 LLE/L and RUQ lymphedema 2/2 endometrial cancer Rx   Onset Date 10/27/13   Prior Therapy none; no compression garments  LYMPHEDEMA/ONCOLOGY QUESTIONNAIRE - 05/28/15 1721    Type   Cancer Type endometrial   Surgeries   Other Surgery Date 10/27/13   Number Lymph Nodes Removed --  unknown   Date Lymphedema/Swelling Started   Date 04/27/15   Treatment   Past Chemotherapy Treatment Yes   Past Radiation Treatment Yes   What other symptoms do you have   Are you Having Heaviness or Tightness Yes   Are you having Pain Yes   Are you having pitting edema Yes   Is it Hard or Difficult finding clothes that fit Yes   Do you have infections No   Is there Decreased scar  mobility Yes   Stemmer Sign Yes   Other Symptoms mild, abdominal swelling w/ increased density and tenderness on L; dense swelling at R abdomen w/ tenderness; LLE swoolen from toes to groin w/ 4+ pitting at distal leg and foot, and 1-2+ pitting at distal thigh. Pt reports swelling at pubis. Skin below knees is reddened and cool to touch w/ distal fibrosis and strong posiutive stemmer sign. Skin is stretched tightly . Signs / symptoms of infection are absent    Lymphedema Stage   Stage STAGE 2 SPONTANEOUSLY IRREVERSIBLE   Lymphedema Assessments   Lymphedema Assessments Lower extremities   Left Lower Extremity Lymphedema   Other Comparative limb volumetrics TBA first Rx session                 OT Treatments/Exercises (OP) - 05/28/15 0001    Manual Therapy   Manual Therapy Edema management                OT Education - 05/28/15 1704    Education provided Yes   Education Details Provided Pt/caregiver skilled education and ADL training throughout visit for lymphedema etiology, progression, and treatment including Intensive and Management Phase Complete Decongestive Therapy (CDT)  Discussed lymphedema precautions, cellulitis risk, and all CDT and LE self-care components, including compression wrapping/ garments & devices, lymphatic pumping ther ex, simple self-MLD, and skin care.   Person(s) Educated Patient;Spouse   Methods Explanation;Handout;Demonstration   Comprehension Verbalized understanding;Need further instruction             OT Long Term Goals - 05/28/15 1729    OT LONG TERM GOAL #1   Title LE self care: Pt able to correctly apply gradient compression wraps to below knee with moderate assistance from caregiver within 2 weeks for optimal limb volume reduction.   Baseline dependent   Time 2   Period Weeks   Status New   OT LONG TERM GOAL #2   Title Pt to achieve at least 10% limb volume reduction by DC to limit LE progression and infection risk, to decrease  pain/discomfort ( from 6 to 4/10) , and to improve standing, walking and sitting tolerance and to improved functional performance in all impaired occupational domains.   Baseline dependent   Time 12   Period Weeks   Status New   OT LONG TERM GOAL #3   Title Pt > 90 % compliant with all daily LE self-care protocols, including simple self-manual lymphatic drainage (MLD), skin care, lymphatic pumping ther ex, and donning/ doffing progression garments with needed level of caregiver assistance to limit progression, infection risk and further functional decline.     Baseline dependent   Time 12   Period Weeks   OT LONG TERM GOAL #4   Title Pt to tolerate compression garments and devices in keeping w/ prescribed wear regime  within 1 week of issue date to retain clinical and functional gains and to limit progression.   Baseline dependent   Time 12   Period Weeks   OT LONG TERM GOAL #5   Title During Management Phase CDT Pt to sustain limb volume reductions achieved during Intensive Phase CDT within 5% utilizing LE self-care protocols, appropriate compression garments/ devices, and needed level of caregiver assistance.   Baseline dependent    Time 6   Period Months   Status New               Plan - 05/28/15 1708    Clinical Impression Statement Pt presents with mild, stage 1-2, L lower extremity/ L lower quadrant lymphedema (LE) secondary to treatment for endometrial cancer.  Pt endorses worsening swelling, pain and sensory discomfort in LLE after onset 4 weeks ago s/p IV placement in dorsal foot during surgical procedure. LLE swelling and pain limit ambulation and functional mobility daily. LE and associated pain/ discomfort limits functional performance in all occupational domains, including basic and instrumental ADLs, ( difficulty fitting LB clothing and street shoes, difficulty sleeping, bathing feet and distal legs, household management) . They limit ability to perform productive  activities, (difficulty performing all activities requiring extended walking, standing, and sitting) and participation in social, family, and community activities. Without skilled Occupational Therapy for Intensive and Management phase Complete Decongestive Therapy (CDT) to address chronic, progressive BLE LE, this patient's condition is expected to worsen and further functional decline is likely.   Pt will benefit from skilled therapeutic intervention in order to improve on the following deficits (Retired) Abnormal gait;Decreased knowledge of precautions;Pain;Decreased knowledge of use of DME;Difficulty walking;Other (comment);Decreased mobility;Increased edema;Decreased range of motion;Decreased skin integrity;Impaired flexibility  increased infection risk   Rehab Potential Good   Clinical Impairments Affecting Rehab Potential pain   OT Frequency 3x / week  and PRN   OT Duration Other (comment)   OT Treatment/Interventions Self-care/ADL training;Therapeutic exercises;Manual Therapy;Therapeutic activities;Manual lymph drainage;Compression bandaging;Scar mobilization;Patient/family education;DME and/or AE instruction;Other (comment)  skin care, fitting compression garments and devices   OT Home Exercise Plan lymphatic pumping therex 2 x daily- 2 sets of 10 in sequence   Recommended Other Services consider OTS compression bike shorts to encourage lymphatic return in deep abdominal lymphatics   Consulted and Agree with Plan of Care Patient        Problem List Patient Active Problem List   Diagnosis Date Noted  . Edema of left lower extremity 05/27/2015  . Vaginal bleeding 02/15/2014  . Acute blood loss anemia 02/15/2014  . Endometrial cancer 02/15/2014  . Anemia, blood loss 02/15/2014  . Depression with anxiety 02/15/2014  . Anorexia 02/15/2014   Andrey Spearman, MS, OTR/L, [facility] 05/28/2015 5:44 PM   [place] MAIN [facility] SERVICES [address] [place], [place], [address] Phone: [phone]   Fax:  [phone]

## 2015-05-28 NOTE — Patient Instructions (Signed)

## 2015-05-29 ENCOUNTER — Ambulatory Visit: Admission: RE | Admit: 2015-05-29 | Payer: 59 | Source: Ambulatory Visit | Admitting: Vascular Surgery

## 2015-05-29 ENCOUNTER — Encounter: Admission: RE | Payer: Self-pay | Source: Ambulatory Visit

## 2015-05-29 SURGERY — LOWER EXTREMITY VENOGRAPHY
Anesthesia: Moderate Sedation | Laterality: Left

## 2015-06-01 ENCOUNTER — Ambulatory Visit: Payer: 59 | Admitting: Occupational Therapy

## 2015-06-01 DIAGNOSIS — I890 Lymphedema, not elsewhere classified: Secondary | ICD-10-CM

## 2015-06-01 NOTE — Patient Instructions (Signed)
As established- see eval  

## 2015-06-01 NOTE — Therapy (Signed)
[place] MAIN [facility] SERVICES [address] [place], [place], [address] Phone: [phone]   Fax:  [phone]  Occupational Therapy Treatment  Patient Details  Name: Kathy Wagner MRN: [id number] Date of Birth: 06-16-1951 Referring Provider:  Mellody Drown, MD  Encounter Date: 06/01/2015      OT End of Session - 06/01/15 1236    Visit Number 2   Number of Visits 36   Date for OT Re-Evaluation 08/26/15   OT Start Time 0910   OT Stop Time 1010   OT Time Calculation (min) 60 min   Activity Tolerance Patient tolerated treatment well;No increased pain   Behavior During Therapy [facility] for tasks assessed/performed      Past Medical History  Diagnosis Date  . Anemia     IDA  . Brain surgery within last 3 months     12/14/14  . Radiation   . Endometrial cancer   . History of radiation therapy   . History of chemotherapy   . Diverticulosis   . GERD (gastroesophageal reflux disease)     Past Surgical History  Procedure Laterality Date  . Ovarian cyst removal  x 3    BTL, surgery for ovarian cysts in 1970, 1972, 1992  . Total laparoscopic hysterectomy/right salpingoophorectomy    . Tubal ligation      BTL, surgery for ovarian cysts in 1970, 1972, 1992  . Brain surgery    . Abdominal hysterectomy      There were no vitals filed for this visit.  Visit Diagnosis:  Lymphedema      Subjective Assessment - 06/01/15 1227    Subjective  Pt presents forCDT treatment visit 2 to address LLE/LLQ lymphedema 2/2 to treatment for endometrial cancer. Pt is accompanied by her spouse. Pt has no new complaints since evaluated laste last week.   Patient is accompained by: Family member   Pertinent History 12/14/14 Brain surgery to remove metastatic mass; Endometrial cancer dx 2015; surgical excision 10/2014, lymph node disection? (Op note not available), chemotherapy and XRT. total laproscopic hysterectomy & R salpingoophorectomy   Limitations  pain   Patient Stated Goals reduce leg swelling and keep it from getting worse; reduce swelling and L leg and abdominal pain   Pain Onset More than a month ago             LYMPHEDEMA/ONCOLOGY QUESTIONNAIRE - 06/01/15 1229    Right Lower Extremity Lymphedema   Other RLE AD volume=2802.60ml. AG limb volume=9257.2 ml   Left Lower Extremity Lymphedema   Other LLE AD volume=4101.22ml; l. AG limb volume=12,567.23   Other AG Limb volume differential (LVD)=24.34%, L>R                 OT Treatments/Exercises (OP) - 06/01/15 0001    ADLs   ADL Comments Se PT EDU   ADL Education Given Yes   Manual Therapy   Manual Therapy Edema management;Compression Bandaging   Manual therapy comments Completed BLE comparative limb volumetrics   Compression Bandaging LLE gradient compression wraps applied circumferentially in gradient configuration from toes to groin as follows: toe wrap x1 under cotton stockinett; 8 cm x 1 to foot and ankle, 10 cm x 2, then  12 cm x 2- all layered over .04 x 10 cm and 12 cm Rosidol Soft foam from A-G.                OT Education - 06/01/15 1235    Education Details Emphasis today on  proper gradient compression application using proper technique, and on BLE ymphatic pumping there ex including seated, standing and supine variations. All andouts given.Good return for there ex.   Person(s) Educated Patient;Spouse   Methods Explanation;Demonstration;Tactile cues;Verbal cues;Handout   Comprehension Verbalized understanding;Returned demonstration;Verbal cues required;Tactile cues required;Need further instruction             OT Long Term Goals - 05/28/15 1729    OT LONG TERM GOAL #1   Title LE self care: Pt able to correctly apply gradient compression wraps to below knee with moderate assistance from caregiver within 2 weeks for optimal limb volume reduction.   Baseline dependent   Time 2   Period Weeks   Status New   OT LONG TERM GOAL #2   Title  Pt to achieve at least 10% limb volume reduction by DC to limit LE progression and infection risk, to decrease pain/discomfort ( from 6 to 4/10) , and to improve standing, walking and sitting tolerance and to improved functional performance in all impaired occupational domains.   Baseline dependent   Time 12   Period Weeks   Status New   OT LONG TERM GOAL #3   Title Pt > 90 % compliant with all daily LE self-care protocols, including simple self-manual lymphatic drainage (MLD), skin care, lymphatic pumping ther ex, and donning/ doffing progression garments with needed level of caregiver assistance to limit progression, infection risk and further functional decline.     Baseline dependent   Time 12   Period Weeks   OT LONG TERM GOAL #4   Title Pt to tolerate compression garments and devices in keeping w/ prescribed wear regime within 1 week of issue date to retain clinical and functional gains and to limit progression.   Baseline dependent   Time 12   Period Weeks   OT LONG TERM GOAL #5   Title During Management Phase CDT Pt to sustain limb volume reductions achieved during Intensive Phase CDT within 5% utilizing LE self-care protocols, appropriate compression garments/ devices, and needed level of caregiver assistance.   Baseline dependent    Time 6   Period Months   Status New               Plan - 06/01/15 1237    Clinical Impression Statement Pt tolerated initial application of compression wraps well today. We were able to add a 3rd visit this week when Pt and spouse will have opportunities to apply wraps. Limb volumetrics reveal a substantial limb volume decrease measuring 24.34%, LLE>RLE.   Pt will benefit from skilled therapeutic intervention in order to improve on the following deficits (Retired) Abnormal gait;Decreased knowledge of precautions;Pain;Decreased knowledge of use of DME;Difficulty walking;Other (comment);Decreased mobility;Increased edema;Decreased range of  motion;Decreased skin integrity;Impaired flexibility  increased infection risk   Rehab Potential Good   Clinical Impairments Affecting Rehab Potential pain   OT Frequency 3x / week  and PRN   OT Duration Other (comment)   OT Treatment/Interventions Self-care/ADL training;Therapeutic exercises;Manual Therapy;Therapeutic activities;Manual lymph drainage;Compression bandaging;Scar mobilization;Patient/family education;DME and/or AE instruction;Other (comment)  skin care, fitting compression garments and devices   OT Home Exercise Plan lymphatic pumping therex 2 x daily- 2 sets of 10 in sequence   Consulted and Agree with Plan of Care Patient        Problem List Patient Active Problem List   Diagnosis Date Noted  . Edema of left lower extremity 05/27/2015  . Vaginal bleeding 02/15/2014  . Acute blood loss anemia 02/15/2014  .  Endometrial cancer 02/15/2014  . Anemia, blood loss 02/15/2014  . Depression with anxiety 02/15/2014  . Anorexia 02/15/2014    Andrey Spearman, MS, OTR/L, [facility] 06/01/2015 12:39 PM  [place] MAIN [facility] SERVICES [address] [place], [place], [address] Phone: [phone]   Fax:  [phone]

## 2015-06-03 ENCOUNTER — Encounter: Payer: Self-pay | Admitting: Urology

## 2015-06-03 ENCOUNTER — Ambulatory Visit (INDEPENDENT_AMBULATORY_CARE_PROVIDER_SITE_OTHER): Payer: 59 | Admitting: Urology

## 2015-06-03 ENCOUNTER — Ambulatory Visit: Payer: 59

## 2015-06-03 VITALS — BP 137/78 | HR 81 | Ht 66.0 in | Wt 185.3 lb

## 2015-06-03 DIAGNOSIS — N3040 Irradiation cystitis without hematuria: Secondary | ICD-10-CM | POA: Diagnosis not present

## 2015-06-03 DIAGNOSIS — N3000 Acute cystitis without hematuria: Secondary | ICD-10-CM | POA: Diagnosis not present

## 2015-06-03 MED ORDER — MIRABEGRON ER 25 MG PO TB24: 25.0000 mg | ORAL_TABLET | Freq: Every day | ORAL | Status: AC

## 2015-06-03 NOTE — Progress Notes (Signed)
06/03/2015 3:32 PM   [name] 11/17/1950 [id number]  Referring provider: Leia Alf, MD [place], [place] [address]  Chief Complaint  Patient presents with  . Cystitis    urinary urgency symptoms    HPI: The patient is a 64 year old female with past medical history includes metastatic endometrial carcinoma that presents for cystitis evaluation. Of note she has received large doses of radiation (? 600 CGy) last summer over a total of 22 cycles. She first noted her symptoms this past April. She notes frequency, urgency, nocturia every hour, and dysuria. She says this is disrupting her life.   Her urine culture on 05/04/2015 was contaminated  PMH: Past Medical History  Diagnosis Date  . Anemia     IDA  . Brain surgery within last 3 months     12/14/14  . Radiation   . Endometrial cancer   . History of radiation therapy   . History of chemotherapy   . Diverticulosis   . GERD (gastroesophageal reflux disease)     Surgical History: Past Surgical History  Procedure Laterality Date  . Ovarian cyst removal  x 3    BTL, surgery for ovarian cysts in 1970, 1972, 1992  . Total laparoscopic hysterectomy/right salpingoophorectomy    . Tubal ligation      BTL, surgery for ovarian cysts in 1970, 1972, 1992  . Brain surgery    . Abdominal hysterectomy      Home Medications:    Medication List       This list is accurate as of: 06/03/15  3:32 PM.  Always use your most recent med list.               ciprofloxacin 500 MG tablet  Commonly known as:  CIPRO  Take 1 tablet (500 mg total) by mouth 2 (two) times daily.     docusate sodium 100 MG capsule  Commonly known as:  COLACE  Take 100 mg by mouth daily as needed for mild constipation.     FLUoxetine 20 MG capsule  Commonly known as:  PROZAC     furosemide 20 MG tablet  Commonly known as:  LASIX  Take 1 tablet orally once daily as needed for leg swelling     HYDROcodone-acetaminophen 5-325 MG tablet  Commonly known as:  NORCO/VICODIN  Take 1 tablet by mouth every 6 (six) hours as needed for moderate pain.     mirabegron ER 25 MG Tb24 tablet  Commonly known as:  MYRBETRIQ  Take 1 tablet (25 mg total) by mouth daily.     omeprazole 20 MG capsule  Commonly known as:  PRILOSEC  Take 20 mg by mouth daily.     oxymetazoline 0.05 % nasal spray  Commonly known as:  AFRIN  Place 1 spray into both nostrils 2 (two) times daily.     promethazine 25 MG tablet  Commonly known as:  PHENERGAN  Take 1 tablet (25 mg total) by mouth every 6 (six) hours as needed for nausea or vomiting.     sennosides-docusate sodium 8.6-50 MG tablet  Commonly known as:  SENOKOT-S  Take 1 tablet by mouth at bedtime.     sodium chloride 0.65 % Soln nasal spray  Commonly known as:  OCEAN  Place 1 spray into both nostrils as needed for congestion.        Allergies:  Allergies  Allergen Reactions  . Azithromycin Swelling    Throat swells  . Codeine Nausea  And Vomiting  . Gluten Meal Other (See Comments)    Cramping & pain  . Hydromorphone Hcl     Other reaction(s): Hallucination Feeling of paranoia  . Lorazepam Other (See Comments)    Altered mental status  . Oxycodone Nausea And Vomiting  . Potassium Sorbate Other (See Comments)  . Soy Allergy Other (See Comments)    Patient states that she does not want soy because it can increase estrogen and make her bleed  . Tramadol Nausea And Vomiting    Family History: Family History  Problem Relation Age of Onset  . Hypertension Mother   . Hypertension Father   . Hematuria Neg Hx   . Kidney cancer Neg Hx   . Bladder Cancer Neg Hx     Social History:  reports that she has never smoked. She has never used smokeless tobacco. She reports that she does not drink alcohol or use illicit drugs.  ROS: UROLOGY Frequent Urination?: Yes Hard to postpone urination?: Yes Burning/pain with urination?: No Get up at  night to urinate?: Yes Leakage of urine?: No Urine stream starts and stops?: Yes Trouble starting stream?: No Do you have to strain to urinate?: No Blood in urine?: No Urinary tract infection?: No Sexually transmitted disease?: No Injury to kidneys or bladder?: No Painful intercourse?: No Weak stream?: No Currently pregnant?: No Vaginal bleeding?: No Last menstrual period?: n  Gastrointestinal Nausea?: Yes Vomiting?: Yes Indigestion/heartburn?: Yes Diarrhea?: No Constipation?: Yes  Constitutional Fever: No Night sweats?: No Weight loss?: No Fatigue?: Yes  Skin Skin rash/lesions?: Yes Itching?: Yes  Eyes Blurred vision?: No Double vision?: No  Ears/Nose/Throat Sore throat?: No Sinus problems?: Yes  Hematologic/Lymphatic Swollen glands?: No Easy bruising?: Yes  Cardiovascular Leg swelling?: Yes Chest pain?: No  Respiratory Cough?: No Shortness of breath?: No  Endocrine Excessive thirst?: No  Musculoskeletal Back pain?: Yes Joint pain?: No  Neurological Headaches?: No Dizziness?: No  Psychologic Depression?: No Anxiety?: No  Physical Exam: BP 137/78 mmHg  Pulse 81  Ht 5\' 6"  (1.676 m)  Wt 185 lb 4.8 oz (84.052 kg)  BMI 29.92 kg/m2  Constitutional:  Alert and oriented, No acute distress. HEENT:  AT, moist mucus membranes.  Trachea midline, no masses. Cardiovascular: No clubbing, cyanosis, or edema. Respiratory: Normal respiratory effort, no increased work of breathing. GI: Abdomen is soft, nontender, nondistended, no abdominal masses GU: No CVA tenderness.  Skin: No rashes, bruises or suspicious lesions. Lymph: No cervical or inguinal adenopathy. Neurologic: Grossly intact, no focal deficits, moving all 4 extremities. Psychiatric: Normal mood and affect.  Laboratory Data: Lab Results  Component Value Date   WBC 7.0 05/12/2015   HGB 9.1* 05/12/2015   HCT 27.7* 05/12/2015   MCV 84.8 05/12/2015   PLT 270 05/12/2015    Lab Results   Component Value Date   CREATININE 1.22* 05/12/2015    No results found for: PSA  No results found for: TESTOSTERONE  No results found for: HGBA1C  Urinalysis    Component Value Date/Time   COLORURINE YELLOW* 05/04/2015 1651   COLORURINE Yellow 12/12/2014 1255   APPEARANCEUR CLEAR* 05/04/2015 1651   APPEARANCEUR Clear 12/12/2014 1255   LABSPEC 1.024 05/04/2015 1651   LABSPEC 1.020 12/12/2014 1255   PHURINE 5.0 05/04/2015 1651   PHURINE 6.0 12/12/2014 1255   GLUCOSEU NEGATIVE 05/04/2015 1651   GLUCOSEU Negative 12/12/2014 1255   HGBUR NEGATIVE 05/04/2015 1651   HGBUR Negative 12/12/2014 [place] 05/04/2015 1651   BILIRUBINUR Negative 12/12/2014 1255  KETONESUR NEGATIVE 05/04/2015 1651   [place] 12/12/2014 [place] 05/04/2015 1651   PROTEINUR 30 mg/dL 12/12/2014 1255   NITRITE NEGATIVE 05/04/2015 1651   NITRITE Negative 12/12/2014 1255   LEUKOCYTESUR TRACE* 05/04/2015 1651   LEUKOCYTESUR Negative 12/12/2014 1255    Pertinent Imaging: PVR: 29  Assessment & Plan:    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 She was given a one week sample, and a one-month supply was called into her pharmacy. She will return in 1 month's time to reassess her symptoms.   Return in about 4 weeks (around 07/01/2015).  Nickie Retort, MD  [facility] [address] [place], [place] [address] [phone]

## 2015-06-03 NOTE — Progress Notes (Signed)
Bladder Scan Patient:void: 29 ml Performed By: Larna Daughters

## 2015-06-04 ENCOUNTER — Ambulatory Visit: Payer: 59 | Admitting: Occupational Therapy

## 2015-06-04 DIAGNOSIS — I890 Lymphedema, not elsewhere classified: Secondary | ICD-10-CM

## 2015-06-04 NOTE — Therapy (Signed)
[place] MAIN [facility] SERVICES [address] [place], [place], [address] Phone: [phone]   Fax:  [phone]  Occupational Therapy Treatment  Patient Details  Name: Kathy Wagner MRN: [id number] Date of Birth: 1951/03/20 Referring Provider:  Mellody Drown, MD  Encounter Date: 06/04/2015      OT End of Session - 06/04/15 1239    Visit Number 3   Number of Visits 36   Date for OT Re-Evaluation 08/26/15   OT Start Time 1107   OT Stop Time 1215   OT Time Calculation (min) 68 min   Activity Tolerance Patient tolerated treatment well;No increased pain   Behavior During Therapy [facility] for tasks assessed/performed      Past Medical History  Diagnosis Date  . Anemia     IDA  . Brain surgery within last 3 months     12/14/14  . Radiation   . Endometrial cancer   . History of radiation therapy   . History of chemotherapy   . Diverticulosis   . GERD (gastroesophageal reflux disease)     Past Surgical History  Procedure Laterality Date  . Ovarian cyst removal  x 3    BTL, surgery for ovarian cysts in 1970, 1972, 1992  . Total laparoscopic hysterectomy/right salpingoophorectomy    . Tubal ligation      BTL, surgery for ovarian cysts in 1970, 1972, 1992  . Brain surgery    . Abdominal hysterectomy      There were no vitals filed for this visit.  Visit Diagnosis:  Lymphedema      Subjective Assessment - 06/04/15 1232    Subjective  Pt presents for CDT treatment visit 3 to address LLE/LLQ lymphedema 2/2 to treatment for endometrial cancer. Pt is accompanied by her spouse. Pt forgot to bring compression wraps issued last session. Pt reports she tolerated wraps >24 hrs after last session without discomfort. She reports the topd edge slid down  when wraps losened. She reports she saw a notable decreased in lymphatic swelling upon removing wraps.,   Patient is accompained by: Family member   Pertinent History 12/14/14 Brain surgery to  remove metastatic mass; Endometrial cancer dx 2015; surgical excision 10/2014, lymph node disection? (Op note not available), chemotherapy and XRT. total laproscopic hysterectomy & R salpingoophorectomy   Limitations pain   Patient Stated Goals reduce leg swelling and keep it from getting worse; reduce swelling and L leg and abdominal pain   Currently in Pain? Yes   Pain Location Abdomen   Pain Descriptors / Indicators Discomfort   Pain Type Chronic pain   Pain Onset More than a month ago   Pain Frequency Intermittent                      OT Treatments/Exercises (OP) - 06/04/15 0001    ADLs   ADL Comments See Pt Edu section   ADL Education Given Yes   Manual Therapy   Manual Therapy Edema management;Manual Lymphatic Drainage (MLD);Compression Bandaging   Compression Bandaging Unable to wrap to groin today 2/2 lack of bandaging supplies. Applied loaned wraps to below knee and Pt will apply remaining 3 wraps to groin when she arrived home as instructed. Utilized co-wrap to fabricate custom toe cap today on trial basis in effort to decrease dense swelling and fibrosis at dorsal foot at base of toes.,                OT Education - 06/04/15  1238    Education provided Yes   Education Details Continued skilled Pt/caregiver Education  And LE ADL training throughout visit for lymphedema self care components, including compression wrapping, compression garment and device wear/care, lymphatic pumping ther ex, simple self-MLD, and skin care.  Emphasis on compression wrapping.   Person(s) Educated Patient   Methods Explanation;Demonstration;Tactile cues;Verbal cues;Handout   Comprehension Verbalized understanding;Returned demonstration;Verbal cues required;Tactile cues required;Need further instruction             OT Long Term Goals - 05/28/15 1729    OT LONG TERM GOAL #1   Title LE self care: Pt able to correctly apply gradient compression wraps to below knee with  moderate assistance from caregiver within 2 weeks for optimal limb volume reduction.   Baseline dependent   Time 2   Period Weeks   Status New   OT LONG TERM GOAL #2   Title Pt to achieve at least 10% limb volume reduction by DC to limit LE progression and infection risk, to decrease pain/discomfort ( from 6 to 4/10) , and to improve standing, walking and sitting tolerance and to improved functional performance in all impaired occupational domains.   Baseline dependent   Time 12   Period Weeks   Status New   OT LONG TERM GOAL #3   Title Pt > 90 % compliant with all daily LE self-care protocols, including simple self-manual lymphatic drainage (MLD), skin care, lymphatic pumping ther ex, and donning/ doffing progression garments with needed level of caregiver assistance to limit progression, infection risk and further functional decline.     Baseline dependent   Time 12   Period Weeks   OT LONG TERM GOAL #4   Title Pt to tolerate compression garments and devices in keeping w/ prescribed wear regime within 1 week of issue date to retain clinical and functional gains and to limit progression.   Baseline dependent   Time 12   Period Weeks   OT LONG TERM GOAL #5   Title During Management Phase CDT Pt to sustain limb volume reductions achieved during Intensive Phase CDT within 5% utilizing LE self-care protocols, appropriate compression garments/ devices, and needed level of caregiver assistance.   Baseline dependent    Time 6   Period Months   Status New               Plan - 06/04/15 1240    Clinical Impression Statement Pt tolerated compression wraps during visit interval without discomfort. She is able to apply compression wraps using proper technique with max assistance and cuing. She is independent with ther ex and reports compliance. Dense distal fibrosis and sensativity to palpation at distal leg and foot are unchanged. Plan to intruduce deep abdominal techniques slowly to  ensure comfort and tolerance.   Pt will benefit from skilled therapeutic intervention in order to improve on the following deficits (Retired) Abnormal gait;Decreased knowledge of precautions;Pain;Decreased knowledge of use of DME;Difficulty walking;Other (comment);Decreased mobility;Increased edema;Decreased range of motion;Decreased skin integrity;Impaired flexibility  increased infection risk   Rehab Potential Good   Clinical Impairments Affecting Rehab Potential pain   OT Frequency 3x / week  and PRN   OT Duration Other (comment)   OT Treatment/Interventions Self-care/ADL training;Therapeutic exercises;Manual Therapy;Therapeutic activities;Manual lymph drainage;Compression bandaging;Scar mobilization;Patient/family education;DME and/or AE instruction;Other (comment)  skin care, fitting compression garments and devices   OT Home Exercise Plan lymphatic pumping therex 2 x daily- 2 sets of 10 in sequence   Consulted and Agree with Plan of  Care Patient        Problem List Patient Active Problem List   Diagnosis Date Noted  . Edema of left lower extremity 05/27/2015  . Below normal amount of sodium in the blood 12/15/2014  . Brain metastases 12/15/2014  . Brain mass 12/12/2014  . Vaginal bleeding 02/15/2014  . Acute blood loss anemia 02/15/2014  . Endometrial cancer 02/15/2014  . Anemia, blood loss 02/15/2014  . Depression with anxiety 02/15/2014  . Anorexia 02/15/2014    Andrey Spearman, MS, OTR/L, [facility] 06/04/2015 12:43 PM    [place] MAIN [facility] SERVICES [address] [place], [place], [address] Phone: [phone]   Fax:  [phone]

## 2015-06-04 NOTE — Patient Instructions (Signed)
As established. See initial evaluation.   

## 2015-06-05 ENCOUNTER — Ambulatory Visit: Payer: 59 | Admitting: Occupational Therapy

## 2015-06-05 ENCOUNTER — Other Ambulatory Visit: Payer: Self-pay | Admitting: Internal Medicine

## 2015-06-05 ENCOUNTER — Telehealth: Payer: Self-pay | Admitting: *Deleted

## 2015-06-05 DIAGNOSIS — I890 Lymphedema, not elsewhere classified: Secondary | ICD-10-CM

## 2015-06-05 DIAGNOSIS — C541 Malignant neoplasm of endometrium: Secondary | ICD-10-CM

## 2015-06-05 MED ORDER — HYDROCODONE-ACETAMINOPHEN 5-325 MG PO TABS: 1.0000 | ORAL_TABLET | Freq: Four times a day (QID) | ORAL | Status: AC | PRN

## 2015-06-05 NOTE — Patient Instructions (Signed)
As established. See initial evaluation.   

## 2015-06-05 NOTE — Therapy (Signed)
[place] MAIN [facility] SERVICES [address] [place], [place], [address] Phone: [phone]   Fax:  [phone]  Occupational Therapy Treatment  Patient Details  Name: Kathy Wagner MRN: [id number] Date of Birth: 1950-12-08 Referring Provider:  Mellody Drown, MD  Encounter Date: 06/05/2015      OT End of Session - 06/05/15 1240    Visit Number 4   Number of Visits 36   Date for OT Re-Evaluation 08/26/15   OT Start Time 0930   OT Stop Time 1045   OT Time Calculation (min) 75 min   Activity Tolerance Patient tolerated treatment well;No increased pain   Behavior During Therapy [facility] for tasks assessed/performed      Past Medical History  Diagnosis Date  . Anemia     IDA  . Brain surgery within last 3 months     12/14/14  . Radiation   . Endometrial cancer   . History of radiation therapy   . History of chemotherapy   . Diverticulosis   . GERD (gastroesophageal reflux disease)     Past Surgical History  Procedure Laterality Date  . Ovarian cyst removal  x 3    BTL, surgery for ovarian cysts in 1970, 1972, 1992  . Total laparoscopic hysterectomy/right salpingoophorectomy    . Tubal ligation      BTL, surgery for ovarian cysts in 1970, 1972, 1992  . Brain surgery    . Abdominal hysterectomy      There were no vitals filed for this visit.  Visit Diagnosis:  Lymphedema      Subjective Assessment - 06/05/15 1040    Subjective  Pt presents for CDT treatment visit 4 to address LLE/LLQ lymphedema 2/2 to treatment for endometrial cancer. Pt reports she tolerated wraps very well since last visit and practiced wrapping above the knee.Pt requests toe wrap review after    Patient is accompained by: Family member   Pertinent History 12/14/14 Brain surgery to remove metastatic mass; Endometrial cancer dx 2015; surgical excision 10/2014, lymph node disection? (Op note not available), chemotherapy and XRT. total laproscopic hysterectomy  & R salpingoophorectomy   Limitations MLD.   Patient Stated Goals reduce leg swelling and keep it from getting worse; reduce swelling and L leg and abdominal pain   Pain Score 6    Pain Location Abdomen   Pain Orientation Right   Pain Descriptors / Indicators Other (Comment)  similar to menstrual cramps   Pain Type Chronic pain   Pain Onset More than a month ago   Pain Frequency Intermittent   Pain Relieving Factors medications             LYMPHEDEMA/ONCOLOGY QUESTIONNAIRE - 06/05/15 1232    Left Lower Extremity Lymphedema   Other LLE AD volume=3480.23m, which reveals 15.15% volume reduction below the knee since commencing CDT on 06/01/15. AG limb volume=9811.53 ml, which reveals overall limb volume reduction of 21.92%. Initial 10% GOAL MET   Other AG Limb volume differential (LVD) decreased to 5.65%, L>R, which is down from initial 24.34%, L>R                 OT Treatments/Exercises (OP) - 06/05/15 0001    ADLs   ADL Comments See Pt edu section   ADL Education Given Yes   Manual Therapy   Manual Therapy Edema management;Manual Lymphatic Drainage (MLD);Compression Bandaging   Manual therapy comments Completed LLE comparative limb volumetrics   Manual Lymphatic Drainage (MLD) LLE Manual lymph drainage  to (MLD) in supine and side lying utilizing ipsilateral inguinal-axillary anastamosis, deep abdominal lymphatics via diaphragmatic breathing  and functional groin LNs as is customary to mobilize cancer related lower extremity LE. Sequence included bilateral "short neck" sequence, J strokes to sub and supraclavicular LN, deep abdominal pathways via effective breathing, (no deep strokes to colon or abdomen 2/2 chronic diarrhea);  functional inguinal LN, L lower extremity- proximal to distal -w/ emphasis on thigh and medial knee bottleneck. Performed light fibrosis technique to [facility] and distal  legs to address fatty fibrosis. Good tolerance.   Compression Bandaging DC cowrap  to toes by Pt request. LLE gradient compression wraps applied circumferentially in gradient configuration from toes to groin as follows: toe wrap x1 under cotton stockinett; 8 cm x 1 to foot and ankle, 10 cm x 2, then  12 cm x 2- all layered over .04 x 10 cm and 12 cm Rosidol Soft foam from A-G.                OT Education - 06/05/15 1239    Education provided Yes   Education Details Continued skilled Pt/caregiver Education  And LE ADL training throughout visit for lymphedema self care components, including compression wrapping, compression garment and device wear/care, lymphatic pumping ther ex, simple self-MLD, and skin care.    Person(s) Educated Patient;Spouse   Methods Explanation;Demonstration;Tactile cues;Verbal cues;Handout   Comprehension Verbalized understanding;Returned demonstration;Verbal cues required;Tactile cues required;Need further instruction             OT Long Term Goals - 05/28/15 1729    OT LONG TERM GOAL #1   Title LE self care: Pt able to correctly apply gradient compression wraps to below knee with moderate assistance from caregiver within 2 weeks for optimal limb volume reduction.   Baseline dependent   Time 2   Period Weeks   Status New   OT LONG TERM GOAL #2   Title Pt to achieve at least 10% limb volume reduction by DC to limit LE progression and infection risk, to decrease pain/discomfort ( from 6 to 4/10) , and to improve standing, walking and sitting tolerance and to improved functional performance in all impaired occupational domains.   Baseline dependent   Time 12   Period Weeks   Status New   OT LONG TERM GOAL #3   Title Pt > 90 % compliant with all daily LE self-care protocols, including simple self-manual lymphatic drainage (MLD), skin care, lymphatic pumping ther ex, and donning/ doffing progression garments with needed level of caregiver assistance to limit progression, infection risk and further functional decline.     Baseline  dependent   Time 12   Period Weeks   OT LONG TERM GOAL #4   Title Pt to tolerate compression garments and devices in keeping w/ prescribed wear regime within 1 week of issue date to retain clinical and functional gains and to limit progression.   Baseline dependent   Time 12   Period Weeks   OT LONG TERM GOAL #5   Title During Management Phase CDT Pt to sustain limb volume reductions achieved during Intensive Phase CDT within 5% utilizing LE self-care protocols, appropriate compression garments/ devices, and needed level of caregiver assistance.   Baseline dependent    Time 6   Period Months   Status New               Plan - 06/05/15 1236    Clinical Impression Statement Pt has made excellent progress towards limb  volume reduction goal since commencing CDT. LLE AD volume=3480.82m, which reveals 15.15% volume reduction below the knee since commencing CDT on 06/01/15. AG limb volume=9811.53 ml, which reveals overall limb volume reduction of 21.92%. Initial 10% GOAL MET. AG Limb volume differential (LVD) decreased to 5.65%, L>R, which is down from initial 24.34%, L>R. Pt  continues to practive LE self care protocols learned to dat, and is tolerating compression without difficulty.    Rehab Potential Good   OT Frequency 3x / week   OT Treatment/Interventions Self-care/ADL training;Therapeutic exercises;Manual Therapy;Therapeutic activities;Manual lymph drainage;Compression bandaging;Scar mobilization;Patient/family education;DME and/or AE instruction;Other (comment)   OT Home Exercise Plan lymphatic pumping therex 2 x daily- 2 sets of 10 in sequence   Consulted and Agree with Plan of Care Patient;Family member/caregiver        Problem List Patient Active Problem List   Diagnosis Date Noted  . Edema of left lower extremity 05/27/2015  . Below normal amount of sodium in the blood 12/15/2014  . Brain metastases 12/15/2014  . Brain mass 12/12/2014  . Vaginal bleeding 02/15/2014  .  Acute blood loss anemia 02/15/2014  . Endometrial cancer 02/15/2014  . Anemia, blood loss 02/15/2014  . Depression with anxiety 02/15/2014  . Anorexia 02/15/2014   TAndrey Spearman MS, OTR/L, [facility]09/30/2016 12:42 PM  [place] [facility] [address] [address]Phone: [phone]  Fax:  [phone]

## 2015-06-05 NOTE — Telephone Encounter (Signed)
Pt needs refill of hydrocodone for her left pelvic pain.  Pt can come pick up rx at front desk

## 2015-06-08 LAB — URINALYSIS, COMPLETE
Bilirubin, UA: NEGATIVE
Glucose, UA: NEGATIVE
Leukocytes, UA: NEGATIVE
NITRITE UA: NEGATIVE
PH UA: 7 (ref 5.0–7.5)
Protein, UA: NEGATIVE
Specific Gravity, UA: 1.015 (ref 1.005–1.030)
Urobilinogen, Ur: 0.2 mg/dL (ref 0.2–1.0)

## 2015-06-08 LAB — MICROSCOPIC EXAMINATION
BACTERIA UA: NONE SEEN
Renal Epithel, UA: NONE SEEN /hpf
WBC, UA: NONE SEEN /hpf (ref 0–?)

## 2015-06-09 ENCOUNTER — Other Ambulatory Visit: Payer: 59

## 2015-06-09 ENCOUNTER — Ambulatory Visit: Payer: 59 | Admitting: Internal Medicine

## 2015-06-10 ENCOUNTER — Ambulatory Visit: Payer: 59

## 2015-06-11 ENCOUNTER — Telehealth: Payer: Self-pay | Admitting: *Deleted

## 2015-06-11 ENCOUNTER — Ambulatory Visit: Payer: 59 | Admitting: Occupational Therapy

## 2015-06-11 NOTE — Telephone Encounter (Signed)
Patient called and she had been to [facility] got good results from the standpoint of brain mets.  She was having more pain and he gave her rx and when she took it to pharmacy she was told [employer] would not approve and she called back there and they gave her OPana and she did not feel like coming back for it so they were going to mail it and the resident who is under Dr. Vallarie Mare told her that he spoke to nurse with Dr. Fransisca Connors they were going to move the appt up.  She wanted to know when it would be and I told her that no one here got a call from [place].  I checked with [facility] Feinstein, [place] and [place] and neither of them got  A call.  I called [place] and spoke to radiation oncology and the lady on the phone read it and told me that the resident contacted [facility] and spoke to [facility] nurse and she was going to call pt back to let her know the appt.  I called pt and let her know what I found out and told her that if she does not get a call by end of the day for her to call tom. And see what they say.

## 2015-06-12 ENCOUNTER — Ambulatory Visit: Payer: 59 | Attending: Obstetrics and Gynecology | Admitting: Occupational Therapy

## 2015-06-12 DIAGNOSIS — I890 Lymphedema, not elsewhere classified: Secondary | ICD-10-CM | POA: Insufficient documentation

## 2015-06-12 NOTE — Therapy (Signed)
[place] MAIN [facility] SERVICES [address] [place], [place], [address] Phone: [phone]   Fax:  [phone]  Occupational Therapy Treatment  Patient Details  Name: Kathy Wagner MRN: [id number] Date of Birth: 1950-09-29 Referring Provider:  Mellody Drown, MD  Encounter Date: 06/12/2015      OT End of Session - 06/12/15 1232    Visit Number 5   Number of Visits 36   Date for OT Re-Evaluation 08/26/15   OT Start Time 0805   OT Stop Time 0915   OT Time Calculation (min) 70 min   Activity Tolerance Patient tolerated treatment well;No increased pain   Behavior During Therapy [facility] for tasks assessed/performed      Past Medical History  Diagnosis Date  . Anemia     IDA  . Brain surgery within last 3 months     12/14/14  . Radiation   . Endometrial cancer   . History of radiation therapy   . History of chemotherapy   . Diverticulosis   . GERD (gastroesophageal reflux disease)     Past Surgical History  Procedure Laterality Date  . Ovarian cyst removal  x 3    BTL, surgery for ovarian cysts in 1970, 1972, 1992  . Total laparoscopic hysterectomy/right salpingoophorectomy    . Tubal ligation      BTL, surgery for ovarian cysts in 1970, 1972, 1992  . Brain surgery    . Abdominal hysterectomy      There were no vitals filed for this visit.  Visit Diagnosis:  Lymphedema      Subjective Assessment - 06/12/15 0811    Subjective  Pt presents for CDT treatment visit 5 to address LLE/LLQ lymphedema 2/2 to treatment for endometrial cancer. Pt missed last appointment due to poorly controlled pain. Pt tells me she hasn't been able to wrap her leg during visit interval.   Patient is accompained by: Family member   Pertinent History 12/14/14 Brain surgery to remove metastatic mass; Endometrial cancer dx 2015; surgical excision 10/2014, lymph node disection? (Op note not available), chemotherapy and XRT. total laproscopic hysterectomy &  R salpingoophorectomy   Limitations MLD.   Patient Stated Goals reduce leg swelling and keep it from getting worse; reduce swelling and L leg and abdominal pain   Pain Score 8    Pain Location Abdomen   Pain Orientation Right;Left   Pain Descriptors / Indicators Constant;Shooting;Stabbing   Pain Type Chronic pain   Pain Onset More than a month ago   Pain Frequency Constant                              OT Education - 06/12/15 1231    Education provided Yes   Education Details Abbreviated Pt LE self care edu today 2/2 pain. Reviewed wraping and MLD very generally to improve tolerance for manual protocols.   Person(s) Educated Patient   Methods Explanation;Demonstration   Comprehension Verbalized understanding;Need further instruction             OT Long Term Goals - 05/28/15 1729    OT LONG TERM GOAL #1   Title LE self care: Pt able to correctly apply gradient compression wraps to below knee with moderate assistance from caregiver within 2 weeks for optimal limb volume reduction.   Baseline dependent   Time 2   Period Weeks   Status New   OT LONG TERM GOAL #2  Title Pt to achieve at least 10% limb volume reduction by DC to limit LE progression and infection risk, to decrease pain/discomfort ( from 6 to 4/10) , and to improve standing, walking and sitting tolerance and to improved functional performance in all impaired occupational domains.   Baseline dependent   Time 12   Period Weeks   Status New   OT LONG TERM GOAL #3   Title Pt > 90 % compliant with all daily LE self-care protocols, including simple self-manual lymphatic drainage (MLD), skin care, lymphatic pumping ther ex, and donning/ doffing progression garments with needed level of caregiver assistance to limit progression, infection risk and further functional decline.     Baseline dependent   Time 12   Period Weeks   OT LONG TERM GOAL #4   Title Pt to tolerate compression garments and  devices in keeping w/ prescribed wear regime within 1 week of issue date to retain clinical and functional gains and to limit progression.   Baseline dependent   Time 12   Period Weeks   OT LONG TERM GOAL #5   Title During Management Phase CDT Pt to sustain limb volume reductions achieved during Intensive Phase CDT within 5% utilizing LE self-care protocols, appropriate compression garments/ devices, and needed level of caregiver assistance.   Baseline dependent    Time 6   Period Months   Status New               Plan - 06/12/15 1233    Clinical Impression Statement Pt unable to tollerate compressin wraps between sessions 2/2 idecreased pain control w/ existing meds. Pt very tender to palpation during MLD at malleoli. She declined deep abdominal techniques today, but was able to perform diaphragmatic breathing as previously instructed. Limb volume and density appear unchanged since last visit.Otherwise    Rehab Potential Good   OT Frequency 3x / week   OT Treatment/Interventions Self-care/ADL training;Therapeutic exercises;Manual Therapy;Therapeutic activities;Manual lymph drainage;Compression bandaging;Scar mobilization;Patient/family education;DME and/or AE instruction;Other (comment)   OT Home Exercise Plan lymphatic pumping therex 2 x daily- 2 sets of 10 in sequence   Consulted and Agree with Plan of Care Patient;Family member/caregiver        Problem List Patient Active Problem List   Diagnosis Date Noted  . Edema of left lower extremity 05/27/2015  . Below normal amount of sodium in the blood 12/15/2014  . Brain metastases ([place]) 12/15/2014  . Brain mass 12/12/2014  . Vaginal bleeding 02/15/2014  . Acute blood loss anemia 02/15/2014  . Endometrial cancer ([place]) 02/15/2014  . Anemia, blood loss 02/15/2014  . Depression with anxiety 02/15/2014  . Anorexia 02/15/2014    Andrey Spearman, MS, OTR/L, [facility] 06/12/2015 12:36 PM   [place] MAIN [facility] SERVICES [address] [place], [place], [address] Phone: [phone]   Fax:  [phone]

## 2015-06-16 ENCOUNTER — Other Ambulatory Visit: Payer: Self-pay | Admitting: Family Medicine

## 2015-06-16 ENCOUNTER — Telehealth: Payer: Self-pay | Admitting: *Deleted

## 2015-06-16 MED ORDER — OXYMORPHONE HCL 5 MG PO TABS: 5.0000 mg | ORAL_TABLET | ORAL | Status: DC | PRN

## 2015-06-17 ENCOUNTER — Ambulatory Visit: Payer: 59 | Admitting: Occupational Therapy

## 2015-06-18 ENCOUNTER — Other Ambulatory Visit: Payer: Self-pay | Admitting: Family Medicine

## 2015-06-18 ENCOUNTER — Telehealth: Payer: Self-pay | Admitting: *Deleted

## 2015-06-18 MED ORDER — OXYMORPHONE HCL 5 MG PO TABS: 5.0000 mg | ORAL_TABLET | ORAL | Status: AC | PRN

## 2015-06-18 MED ORDER — OXYMORPHONE HCL ER 5 MG PO TB12: 5.0000 mg | ORAL_TABLET | Freq: Two times a day (BID) | ORAL | Status: AC

## 2015-06-18 NOTE — Telephone Encounter (Signed)
Called pt to see how her pain is on the new pain med.  She states it is better than the hydrocodone.  Once she takes it and waits for it to kick in it lasts longer than the hydrocodone does.  I told her that i spoke with Dr. Fransisca Connors yest. And he wanted to keep her appt the same esp. Because we here in [place] helping her with the pain.  i also spoke to Georgeanne Nim NP about dr Fransisca Connors rec: to put her on long acting pain med if the medicine she has is helping her but not as much as we had hoped.  Magda Paganini prescribed her OPANA ER for long acting and OPANA 5 mg IR for breakthrough pain.  Patient will get her husband to pick her rx. Pt will keep her appt for ct scan and to see berchuck the next day.

## 2015-06-19 ENCOUNTER — Ambulatory Visit: Payer: 59 | Admitting: Occupational Therapy

## 2015-06-19 DIAGNOSIS — I890 Lymphedema, not elsewhere classified: Secondary | ICD-10-CM

## 2015-06-19 NOTE — Therapy (Signed)
[place] MAIN [facility] SERVICES [address] [place], [place], [address] Phone: [phone]   Fax:  [phone]  Occupational Therapy Treatment  Patient Details  Name: Kathy Wagner MRN: [id number] Date of Birth: Jan 07, 1951 No Data Recorded  Encounter Date: 06/19/2015      OT End of Session - 06/19/15 1719    Visit Number 6   Number of Visits 36   Date for OT Re-Evaluation 08/26/15   OT Start Time 1512   OT Stop Time 1625   OT Time Calculation (min) 73 min   Activity Tolerance Patient tolerated treatment well;No increased pain   Behavior During Therapy [facility] for tasks assessed/performed      Past Medical History  Diagnosis Date  . Anemia     IDA  . Brain surgery within last 3 months     12/14/14  . Radiation   . Endometrial cancer   . History of radiation therapy   . History of chemotherapy   . Diverticulosis   . GERD (gastroesophageal reflux disease)     Past Surgical History  Procedure Laterality Date  . Ovarian cyst removal  x 3    BTL, surgery for ovarian cysts in 1970, 1972, 1992  . Total laparoscopic hysterectomy/right salpingoophorectomy    . Tubal ligation      BTL, surgery for ovarian cysts in 1970, 1972, 1992  . Brain surgery    . Abdominal hysterectomy      There were no vitals filed for this visit.  Visit Diagnosis:  Lymphedema      Subjective Assessment - 06/19/15 1517    Subjective  Pt presents for CDT treatment visit 6 to address LLE/LLQ lymphedema 2/2 to treatment for endometrial cancer. 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 Shje reports she has a new pain medication and is hopeful she will get better control with it once it has built up in her system.   Patient is accompained by: Family member   Pertinent History 12/14/14 Brain surgery to remove metastatic mass; Endometrial cancer dx 2015; surgical excision 10/2014, lymph node disection? (Op note not  available), chemotherapy and XRT. total laproscopic hysterectomy & R salpingoophorectomy   Limitations MLD.   Patient Stated Goals reduce leg swelling and keep it from getting worse; reduce swelling and L leg and abdominal pain   Currently in Pain? Yes   Pain Score 9    Pain Location Abdomen  area of ovary   Pain Orientation Left;Right   Pain Descriptors / Indicators Stabbing;Squeezing;Tightness;Other (Comment)  twisting, raw   Pain Type Chronic pain   Pain Radiating Towards contralateral side   Pain Onset More than a month ago   Pain Frequency Constant                              OT Education - 06/19/15 1718    Education provided No   Education Details No Pt edu for LE self cre today 2/2 pain throughout session             OT Long Term Goals - 05/28/15 1729    OT LONG TERM GOAL #1   Title LE self care: Pt able to correctly apply gradient compression wraps to below knee with moderate assistance from caregiver within 2 weeks for optimal limb volume reduction.   Baseline dependent   Time 2   Period Weeks   Status New  OT LONG TERM GOAL #2   Title Pt to achieve at least 10% limb volume reduction by DC to limit LE progression and infection risk, to decrease pain/discomfort ( from 6 to 4/10) , and to improve standing, walking and sitting tolerance and to improved functional performance in all impaired occupational domains.   Baseline dependent   Time 12   Period Weeks   Status New   OT LONG TERM GOAL #3   Title Pt > 90 % compliant with all daily LE self-care protocols, including simple self-manual lymphatic drainage (MLD), skin care, lymphatic pumping ther ex, and donning/ doffing progression garments with needed level of caregiver assistance to limit progression, infection risk and further functional decline.     Baseline dependent   Time 12   Period Weeks   OT LONG TERM GOAL #4   Title Pt to tolerate compression garments and devices in keeping w/  prescribed wear regime within 1 week of issue date to retain clinical and functional gains and to limit progression.   Baseline dependent   Time 12   Period Weeks   OT LONG TERM GOAL #5   Title During Management Phase CDT Pt to sustain limb volume reductions achieved during Intensive Phase CDT within 5% utilizing LE self-care protocols, appropriate compression garments/ devices, and needed level of caregiver assistance.   Baseline dependent    Time 6   Period Months   Status New               Plan - 06/19/15 1719    Clinical Impression Statement Pt tolerated MLD well today despite intractable pain. One Pt finds a comfortable position on Rx table, OT does not request repositioning during MLD. We are still able to access needed pathways and MLD does provide slight analgesis effect. Visible reduction is also noted after Rx today. Wraps are very well tolerated and also give releif to leg discomfort. We'll modify CDT PRN until pain control is improved with new medication.   Rehab Potential Good   OT Frequency 3x / week   OT Treatment/Interventions Self-care/ADL training;Therapeutic exercises;Manual Therapy;Therapeutic activities;Manual lymph drainage;Compression bandaging;Scar mobilization;Patient/family education;DME and/or AE instruction;Other (comment)   OT Home Exercise Plan lymphatic pumping therex 2 x daily- 2 sets of 10 in sequence   Consulted and Agree with Plan of Care Patient;Family member/caregiver        Problem List Patient Active Problem List   Diagnosis Date Noted  . Edema of left lower extremity 05/27/2015  . Below normal amount of sodium in the blood 12/15/2014  . Brain metastases ([place]) 12/15/2014  . Brain mass 12/12/2014  . Vaginal bleeding 02/15/2014  . Acute blood loss anemia 02/15/2014  . Endometrial cancer ([place]) 02/15/2014  . Anemia, blood loss 02/15/2014  . Depression with anxiety 02/15/2014  . Anorexia 02/15/2014    Andrey Spearman, MS, OTR/L,  [facility] 06/19/2015 5:23 PM   [place] MAIN [facility] SERVICES [address] [place], [place], [address] Phone: [phone]   Fax:  [phone]  Name: DORIANNA MCKIVER MRN: [id number] Date of Birth: 04-08-1951

## 2015-06-19 NOTE — Patient Instructions (Signed)
LE instructions and precautions as established- see initial eval.   

## 2015-06-22 ENCOUNTER — Encounter: Payer: 59 | Admitting: Occupational Therapy

## 2015-06-22 ENCOUNTER — Ambulatory Visit: Payer: 59 | Admitting: Occupational Therapy

## 2015-06-22 DIAGNOSIS — I890 Lymphedema, not elsewhere classified: Secondary | ICD-10-CM | POA: Diagnosis not present

## 2015-06-22 NOTE — Therapy (Signed)
[place] MAIN [facility] SERVICES [address] [place], [place], [address] Phone: [phone]   Fax:  [phone]  Occupational Therapy Treatment  Patient Details  Name: JOYCELINE Wagner MRN: [id number] Date of Birth: [birth year] No Data Recorded  Encounter Date: 06/22/2015      OT End of Session - 06/22/15 1639    Visit Number 7   Number of Visits 36   Date for OT Re-Evaluation 08/26/15   OT Start Time 1505   OT Stop Time 1612   OT Time Calculation (min) 67 min   Activity Tolerance Patient tolerated treatment well;No increased pain   Behavior During Therapy [facility] for tasks assessed/performed      Past Medical History  Diagnosis Date  . Anemia     IDA  . Brain surgery within last 3 months     12/14/14  . Radiation   . Endometrial cancer   . History of radiation therapy   . History of chemotherapy   . Diverticulosis   . GERD (gastroesophageal reflux disease)     Past Surgical History  Procedure Laterality Date  . Ovarian cyst removal  x 3    BTL, surgery for ovarian cysts in 1970, 1972, 1992  . Total laparoscopic hysterectomy/right salpingoophorectomy    . Tubal ligation      BTL, surgery for ovarian cysts in 1970, 1972, 1992  . Brain surgery    . Abdominal hysterectomy      There were no vitals filed for this visit.  Visit Diagnosis:  Lymphedema      Subjective Assessment - 06/22/15 1510    Subjective  Pt presents for CDT treatment visit & to address LLE/LLQ lymphedema 2/2 to treatment for endometrial cancer. Pt reports she had some intermittent relief from pain over the weekend with new meds, but was unable to tolerate wraps. Pt less talkative today.   Patient is accompained by: Family member   Pertinent History 12/14/14 Brain surgery to remove metastatic mass; Endometrial cancer dx 2015; surgical excision 10/2014, lymph node disection? (Op note not available), chemotherapy and XRT. total laproscopic hysterectomy & R  salpingoophorectomy   Limitations MLD.   Patient Stated Goals reduce leg swelling and keep it from getting worse; reduce swelling and L leg and abdominal pain   Pain Score 5    Pain Location Abdomen   Pain Orientation Right;Left   Pain Descriptors / Indicators Grimacing;Jabbing;Shooting;Squeezing;Stabbing;Aching   Pain Type Intractable pain   Pain Onset More than a month ago   Effect of Pain on Daily Activities currently limiting functional performance in self care, productive and leisure activities , and social participation at home and in the community                      OT Treatments/Exercises (OP) - 06/22/15 0001    Manual Therapy   Manual Therapy Edema management;Manual Lymphatic Drainage (MLD);Compression Bandaging;Other (comment)  skin care throughout MLD; fibrosis techniques to L foot and    Manual Lymphatic Drainage (MLD) LLE Manual lymph drainage to (MLD) in supine and side lying utilizing ipsilateral inguinal-axillary anastamosis, deep abdominal lymphatics via diaphragmatic breathing  and functional groin LNs as is customary to mobilize cancer related lower extremity LE. Sequence included bilateral "short neck" sequence, J strokes to sub and supraclavicular LN, deep abdominal pathways via effective breathing, (no deep strokes to colon or abdomen 2/2 chronic diarrhea);  functional inguinal LN, L lower extremity- proximal to distal -w/ emphasis on thigh  and medial knee bottleneck. Performed light fibrosis technique to [facility] and distal  legs to address fatty fibrosis. Good tolerance.   Compression Bandaging LLE gradient compression wraps applied circumferentially in gradient configuration from toes to groin as follows: toe wrap x1 under cotton stockinett; added black foam dorsaal foot pad extending from base of toes to ankle to apply increased compression to foot; 8 cm x 1 to foot and ankle, 10 cm x 2, then  12 cm x 2- all layered over .04 x 10 cm and 12 cm Rosidol Soft  foam from A-G.                OT Education - 06/22/15 1639    Education provided No   Education Details no edu during MLD 2/2 pain             OT Long Term Goals - 05/28/15 1729    OT LONG TERM GOAL #1   Title LE self care: Pt able to correctly apply gradient compression wraps to below knee with moderate assistance from caregiver within 2 weeks for optimal limb volume reduction.   Baseline dependent   Time 2   Period Weeks   Status New   OT LONG TERM GOAL #2   Title Pt to achieve at least 10% limb volume reduction by DC to limit LE progression and infection risk, to decrease pain/discomfort ( from 6 to 4/10) , and to improve standing, walking and sitting tolerance and to improved functional performance in all impaired occupational domains.   Baseline dependent   Time 12   Period Weeks   Status New   OT LONG TERM GOAL #3   Title Pt > 90 % compliant with all daily LE self-care protocols, including simple self-manual lymphatic drainage (MLD), skin care, lymphatic pumping ther ex, and donning/ doffing progression garments with needed level of caregiver assistance to limit progression, infection risk and further functional decline.     Baseline dependent   Time 12   Period Weeks   OT LONG TERM GOAL #4   Title Pt to tolerate compression garments and devices in keeping w/ prescribed wear regime within 1 week of issue date to retain clinical and functional gains and to limit progression.   Baseline dependent   Time 12   Period Weeks   OT LONG TERM GOAL #5   Title During Management Phase CDT Pt to sustain limb volume reductions achieved during Intensive Phase CDT within 5% utilizing LE self-care protocols, appropriate compression garments/ devices, and needed level of caregiver assistance.   Baseline dependent    Time 6   Period Months   Status New               Plan - 06/22/15 1640    Clinical Impression Statement LLE is notably more swollen and w/ pitting  distally today as difficulty w/ pain control is limiting tolerance for compression during visit intervals. Despite pain throughout session, Pt tolerated deeper work on foot and ankle without tenderness. Visible volume reduction and tissue softening observed at end of session.   Rehab Potential Good   OT Frequency 3x / week   OT Duration 12 weeks   OT Treatment/Interventions Self-care/ADL training;Therapeutic exercises;Manual Therapy;Therapeutic activities;Manual lymph drainage;Compression bandaging;Scar mobilization;Patient/family education;DME and/or AE instruction;Other (comment)   OT Home Exercise Plan lymphatic pumping therex 2 x daily- 2 sets of 10 in sequence   Consulted and Agree with Plan of Care Patient;Family member/caregiver        Problem  List Patient Active Problem List   Diagnosis Date Noted  . Edema of left lower extremity 05/27/2015  . Below normal amount of sodium in the blood 12/15/2014  . Brain metastases ([place]) 12/15/2014  . Brain mass 12/12/2014  . Vaginal bleeding 02/15/2014  . Acute blood loss anemia 02/15/2014  . Endometrial cancer ([place]) 02/15/2014  . Anemia, blood loss 02/15/2014  . Depression with anxiety 02/15/2014  . Anorexia 02/15/2014   Andrey Spearman, MS, OTR/L, [facility] 06/22/2015 4:45 PM   [place] MAIN [facility] SERVICES [address] [place], [place], [address] Phone: [phone]   Fax:  [phone]  Name: Kathy Wagner MRN: [id number] Date of Birth: Feb 02, 1951

## 2015-06-24 ENCOUNTER — Ambulatory Visit: Payer: 59 | Admitting: Occupational Therapy

## 2015-06-24 ENCOUNTER — Encounter: Payer: 59 | Admitting: Occupational Therapy

## 2015-06-24 DIAGNOSIS — I890 Lymphedema, not elsewhere classified: Secondary | ICD-10-CM | POA: Diagnosis not present

## 2015-06-24 NOTE — Therapy (Signed)
[place] MAIN [facility] SERVICES [address] [place], [place], [address] Phone: [phone]   Fax:  [phone]  Occupational Therapy Treatment  Patient Details  Name: Kathy Wagner MRN: [id number] Date of Birth: 02/13/1951 No Data Recorded  Encounter Date: 06/24/2015      OT End of Session - 06/24/15 1620    Visit Number 8   Number of Visits 36   Date for OT Re-Evaluation 08/26/15   OT Start Time 1506   OT Stop Time 1612   OT Time Calculation (min) 66 min      Past Medical History  Diagnosis Date  . Anemia     IDA  . Brain surgery within last 3 months     12/14/14  . Radiation   . Endometrial cancer   . History of radiation therapy   . History of chemotherapy   . Diverticulosis   . GERD (gastroesophageal reflux disease)     Past Surgical History  Procedure Laterality Date  . Ovarian cyst removal  x 3    BTL, surgery for ovarian cysts in 1970, 1972, 1992  . Total laparoscopic hysterectomy/right salpingoophorectomy    . Tubal ligation      BTL, surgery for ovarian cysts in 1970, 1972, 1992  . Brain surgery    . Abdominal hysterectomy      There were no vitals filed for this visit.  Visit Diagnosis:  Lymphedema      Subjective Assessment - 06/24/15 1621    Subjective  Pt presents for CDT treatment visit 8 to address LLE/LLQ lymphedema 2/2 to treatment for endometrial cancer.Pt is more engaged in session today w/ improved pain control.   Pertinent History 12/14/14 Brain surgery to remove metastatic mass; Endometrial cancer dx 2015; surgical excision 10/2014, lymph node disection? (Op note not available), chemotherapy and XRT. total laproscopic hysterectomy & R salpingoophorectomy   Limitations pain   Patient Stated Goals reduce leg swelling and keep it from getting worse; reduce swelling and L leg and abdominal pain   Currently in Pain? Other (Comment)  not rated today   Pain Onset More than a month ago                       OT Treatments/Exercises (OP) - 06/24/15 0001    ADLs   ADL Education Given Yes   Manual Therapy   Manual Therapy Edema management;Manual Lymphatic Drainage (MLD);Compression Bandaging;Other (comment)   Manual Lymphatic Drainage (MLD) LLE Manual lymph drainage to (MLD) in supine and side lying utilizing ipsilateral inguinal-axillary anastamosis, deep abdominal lymphatics via diaphragmatic breathing  and functional groin LNs as is customary to mobilize cancer related lower extremity LE. Sequence included bilateral "short neck" sequence, J strokes to sub and supraclavicular LN, deep abdominal pathways via effective breathing, (no deep strokes to colon or abdomen 2/2 chronic diarrhea);  functional inguinal LN, L lower extremity- proximal to distal -w/ emphasis on thigh and medial knee bottleneck. Performed light fibrosis technique to [facility] and distal  legs to address fatty fibrosis. Good tolerance.   Compression Bandaging LLE gradient compression wraps applied circumferentially in gradient configuration from toes to groin as follows: toe wrap x1 under cotton stockinett; added black foam dorsaal foot pad extending from base of toes to ankle to apply increased compression to foot; 8 cm x 1 to foot and ankle, 10 cm x 2, then  12 cm x 2- all layered over .04 x 10 cm and 12 cm  Rosidol Soft foam from A-G.                OT Education - 06/24/15 1625    Education provided Yes   Education Details Continued skilled Pt/caregiver Education  And LE ADL training throughout visit for lymphedema self care components, including compression wrapping, compression garment and device wear/care, lymphatic pumping ther ex, simple self-MLD, and skin care.    Person(s) Educated Patient   Methods Explanation;Demonstration   Comprehension Verbalized understanding;Need further instruction             OT Long Term Goals - 05/28/15 1729    OT LONG TERM GOAL #1   Title LE self  care: Pt able to correctly apply gradient compression wraps to below knee with moderate assistance from caregiver within 2 weeks for optimal limb volume reduction.   Baseline dependent   Time 2   Period Weeks   Status New   OT LONG TERM GOAL #2   Title Pt to achieve at least 10% limb volume reduction by DC to limit LE progression and infection risk, to decrease pain/discomfort ( from 6 to 4/10) , and to improve standing, walking and sitting tolerance and to improved functional performance in all impaired occupational domains.   Baseline dependent   Time 12   Period Weeks   Status New   OT LONG TERM GOAL #3   Title Pt > 90 % compliant with all daily LE self-care protocols, including simple self-manual lymphatic drainage (MLD), skin care, lymphatic pumping ther ex, and donning/ doffing progression garments with needed level of caregiver assistance to limit progression, infection risk and further functional decline.     Baseline dependent   Time 12   Period Weeks   OT LONG TERM GOAL #4   Title Pt to tolerate compression garments and devices in keeping w/ prescribed wear regime within 1 week of issue date to retain clinical and functional gains and to limit progression.   Baseline dependent   Time 12   Period Weeks   OT LONG TERM GOAL #5   Title During Management Phase CDT Pt to sustain limb volume reductions achieved during Intensive Phase CDT within 5% utilizing LE self-care protocols, appropriate compression garments/ devices, and needed level of caregiver assistance.   Baseline dependent    Time 6   Period Months   Status New               Plan - 06/24/15 1627    Clinical Impression Statement Foot and ankle swelling is increased again today, which may be partivally due to Pt leaving wraps in place too many hours w/out rewrapping. Pt encouraged to remove and wrap again to avoid wraps falling down and bunching at ankle causing tournequet effect and increased foot swelling.    Rehab Potential Good   OT Frequency 3x / week   OT Duration 12 weeks   OT Treatment/Interventions Self-care/ADL training;Therapeutic exercises;Manual Therapy;Therapeutic activities;Manual lymph drainage;Compression bandaging;Scar mobilization;Patient/family education;DME and/or AE instruction;Other (comment)   OT Home Exercise Plan lymphatic pumping therex 2 x daily- 2 sets of 10 in sequence   Consulted and Agree with Plan of Care Patient;Family member/caregiver        Problem List Patient Active Problem List   Diagnosis Date Noted  . Edema of left lower extremity 05/27/2015  . Below normal amount of sodium in the blood 12/15/2014  . Brain metastases ([place]) 12/15/2014  . Brain mass 12/12/2014  . Vaginal bleeding 02/15/2014  . Acute blood  loss anemia 02/15/2014  . Endometrial cancer ([place]) 02/15/2014  . Anemia, blood loss 02/15/2014  . Depression with anxiety 02/15/2014  . Anorexia 02/15/2014    Andrey Spearman, MS, OTR/L, [facility] 06/24/2015 4:30 PM   [place] MAIN [facility] SERVICES [address] [place], [place], [address] Phone: [phone]   Fax:  [phone]  Name: Kathy Wagner MRN: [id number] Date of Birth: 1951-07-05

## 2015-06-24 NOTE — Patient Instructions (Signed)
LE instructions and precautions as established- see initial eval.   

## 2015-06-26 ENCOUNTER — Encounter: Payer: 59 | Admitting: Occupational Therapy

## 2015-06-26 ENCOUNTER — Ambulatory Visit: Payer: 59 | Admitting: Occupational Therapy

## 2015-06-26 DIAGNOSIS — I890 Lymphedema, not elsewhere classified: Secondary | ICD-10-CM

## 2015-06-26 NOTE — Therapy (Signed)
[place] MAIN [facility] SERVICES [address] [place], [place], [address] Phone: [phone]   Fax:  [phone]  Occupational Therapy Treatment  Patient Details  Name: Kathy Wagner MRN: [id number] Date of Birth: Mar 29, 1951 No Data Recorded  Encounter Date: 06/26/2015      OT End of Session - 06/26/15 1636    Visit Number 9   Number of Visits 36   Date for OT Re-Evaluation 08/26/15   OT Start Time 1506   OT Stop Time 1618   OT Time Calculation (min) 72 min   Activity Tolerance Patient tolerated treatment well;No increased pain   Behavior During Therapy [facility] for tasks assessed/performed      Past Medical History  Diagnosis Date  . Anemia     IDA  . Brain surgery within last 3 months     12/14/14  . Radiation   . Endometrial cancer   . History of radiation therapy   . History of chemotherapy   . Diverticulosis   . GERD (gastroesophageal reflux disease)     Past Surgical History  Procedure Laterality Date  . Ovarian cyst removal  x 3    BTL, surgery for ovarian cysts in 1970, 1972, 1992  . Total laparoscopic hysterectomy/right salpingoophorectomy    . Tubal ligation      BTL, surgery for ovarian cysts in 1970, 1972, 1992  . Brain surgery    . Abdominal hysterectomy      There were no vitals filed for this visit.  Visit Diagnosis:  Lymphedema      Subjective Assessment - 06/26/15 1635    Subjective  Pt presents for CDT treatment visit 9 to address LLE/LLQ lymphedema 2/2 to treatment for endometrial cancer.Pt is more engaged in session again today w/ improved pain control. No new complaints.   Patient is accompained by: Family member   Pertinent History 12/14/14 Brain surgery to remove metastatic mass; Endometrial cancer dx 2015; surgical excision 10/2014, lymph node disection? (Op note not available), chemotherapy and XRT. total laproscopic hysterectomy & R salpingoophorectomy   Limitations pain   Patient Stated Goals  reduce leg swelling and keep it from getting worse; reduce swelling and L leg and abdominal pain   Currently in Pain? Yes  not rated "same as last visit" which was 5/10   Pain Onset More than a month ago                      OT Treatments/Exercises (OP) - 06/26/15 0001    Manual Therapy   Manual Therapy Edema management;Manual Lymphatic Drainage (MLD);Compression Bandaging;Other (comment)   Manual Lymphatic Drainage (MLD) LLE Manual lymph drainage to (MLD) in supine and side lying utilizing ipsilateral inguinal-axillary anastamosis, deep abdominal lymphatics via diaphragmatic breathing  and functional groin LNs as is customary to mobilize cancer related lower extremity LE. Sequence included bilateral "short neck" sequence, J strokes to sub and supraclavicular LN, deep abdominal pathways via effective breathing, (no deep strokes to colon or abdomen 2/2 chronic diarrhea);  functional inguinal LN, L lower extremity- proximal to distal -w/ emphasis on thigh and medial knee bottleneck. Performed light fibrosis technique to [facility] and distal  legs to address fatty fibrosis. Good tolerance.   Compression Bandaging LLE gradient compression wraps applied circumferentially in gradient configuration from toes to groin as follows: toe wrap x1 under cotton stockinett; added black foam dorsaal foot pad extending from base of toes to ankle to apply increased compression to foot; 8  cm x 1 to foot and ankle, 10 cm x 2, then  12 cm x 2- all layered over .04 x 10 cm and 12 cm Rosidol Soft foam from A-G.                OT Education - 06/26/15 1636    Education provided Yes   Education Details Emphasis of LE self care training today on simple self MLD and compression wrapping. Pt has had a difficult time with self care compliance 2/2 limited pain control. We'll continue to worlk on all each session and encourage her to do    Person(s) Educated Patient   Methods Explanation;Tactile cues    Comprehension Verbalized understanding;Need further instruction             OT Long Term Goals - 05/28/15 1729    OT LONG TERM GOAL #1   Title LE self care: Pt able to correctly apply gradient compression wraps to below knee with moderate assistance from caregiver within 2 weeks for optimal limb volume reduction.   Baseline dependent   Time 2   Period Weeks   Status New   OT LONG TERM GOAL #2   Title Pt to achieve at least 10% limb volume reduction by DC to limit LE progression and infection risk, to decrease pain/discomfort ( from 6 to 4/10) , and to improve standing, walking and sitting tolerance and to improved functional performance in all impaired occupational domains.   Baseline dependent   Time 12   Period Weeks   Status New   OT LONG TERM GOAL #3   Title Pt > 90 % compliant with all daily LE self-care protocols, including simple self-manual lymphatic drainage (MLD), skin care, lymphatic pumping ther ex, and donning/ doffing progression garments with needed level of caregiver assistance to limit progression, infection risk and further functional decline.     Baseline dependent   Time 12   Period Weeks   OT LONG TERM GOAL #4   Title Pt to tolerate compression garments and devices in keeping w/ prescribed wear regime within 1 week of issue date to retain clinical and functional gains and to limit progression.   Baseline dependent   Time 12   Period Weeks   OT LONG TERM GOAL #5   Title During Management Phase CDT Pt to sustain limb volume reductions achieved during Intensive Phase CDT within 5% utilizing LE self-care protocols, appropriate compression garments/ devices, and needed level of caregiver assistance.   Baseline dependent    Time 6   Period Months   Status New               Plan - 06/26/15 1640    Clinical Impression Statement Foot and akle remain swollen with mottled discoloration. Pt reports ongoing tenderness at lateral foot and heel area. Pyt  tolerated sidelying without difficulty today. We were able to apply wraps to full extent of leg as this assists with keeping them in place.   Rehab Potential Good   OT Frequency 3x / week   OT Duration 12 weeks   OT Treatment/Interventions Self-care/ADL training;Therapeutic exercises;Manual Therapy;Therapeutic activities;Manual lymph drainage;Compression bandaging;Scar mobilization;Patient/family education;DME and/or AE instruction;Other (comment)   OT Home Exercise Plan lymphatic pumping therex 2 x daily- 2 sets of 10 in sequence   Consulted and Agree with Plan of Care Patient;Family member/caregiver        Problem List Patient Active Problem List   Diagnosis Date Noted  . Edema of left lower extremity  05/27/2015  . Below normal amount of sodium in the blood 12/15/2014  . Brain metastases ([place]) 12/15/2014  . Brain mass 12/12/2014  . Vaginal bleeding 02/15/2014  . Acute blood loss anemia 02/15/2014  . Endometrial cancer ([place]) 02/15/2014  . Anemia, blood loss 02/15/2014  . Depression with anxiety 02/15/2014  . Anorexia 02/15/2014    Andrey Spearman, MS, OTR/L, [facility] 06/26/2015 4:42 PM  [place] MAIN [facility] SERVICES [address] [place], [place], [address] Phone: [phone]   Fax:  [phone]  Name: Kathy Wagner MRN: [id number] Date of Birth: [birth year]

## 2015-06-26 NOTE — Patient Instructions (Signed)
LE instructions and precautions as established- see initial eval.   

## 2015-06-29 ENCOUNTER — Encounter: Payer: 59 | Admitting: Occupational Therapy

## 2015-06-29 ENCOUNTER — Ambulatory Visit: Payer: 59 | Admitting: Occupational Therapy

## 2015-07-01 ENCOUNTER — Ambulatory Visit: Payer: 59

## 2015-07-02 ENCOUNTER — Encounter: Payer: 59 | Admitting: Occupational Therapy

## 2015-07-03 ENCOUNTER — Ambulatory Visit: Payer: 59

## 2015-07-06 ENCOUNTER — Ambulatory Visit: Payer: 59 | Admitting: Occupational Therapy

## 2015-07-06 ENCOUNTER — Encounter: Payer: 59 | Admitting: Occupational Therapy

## 2015-07-09 ENCOUNTER — Ambulatory Visit: Payer: 59 | Admitting: Occupational Therapy

## 2015-07-09 ENCOUNTER — Encounter: Payer: 59 | Admitting: Occupational Therapy

## 2015-07-10 ENCOUNTER — Emergency Department
Admission: EM | Admit: 2015-07-10 | Discharge: 2015-07-10 | Disposition: A | Discharge status: Other Acute Inpatient Hospital | Payer: 59 | Attending: Emergency Medicine | Admitting: Emergency Medicine

## 2015-07-10 ENCOUNTER — Emergency Department: Payer: 59

## 2015-07-10 ENCOUNTER — Other Ambulatory Visit: Payer: Self-pay

## 2015-07-10 ENCOUNTER — Encounter: Payer: Self-pay | Admitting: Emergency Medicine

## 2015-07-10 DIAGNOSIS — Z79899 Other long term (current) drug therapy: Secondary | ICD-10-CM | POA: Diagnosis not present

## 2015-07-10 DIAGNOSIS — K9289 Other specified diseases of the digestive system: Secondary | ICD-10-CM | POA: Diagnosis present

## 2015-07-10 DIAGNOSIS — K56600 Partial intestinal obstruction, unspecified as to cause: Secondary | ICD-10-CM

## 2015-07-10 DIAGNOSIS — M25473 Effusion, unspecified ankle: Secondary | ICD-10-CM

## 2015-07-10 DIAGNOSIS — R2242 Localized swelling, mass and lump, left lower limb: Secondary | ICD-10-CM | POA: Insufficient documentation

## 2015-07-10 DIAGNOSIS — K56609 Unspecified intestinal obstruction, unspecified as to partial versus complete obstruction: Secondary | ICD-10-CM

## 2015-07-10 DIAGNOSIS — R600 Localized edema: Secondary | ICD-10-CM

## 2015-07-10 DIAGNOSIS — K5669 Other intestinal obstruction: Secondary | ICD-10-CM | POA: Diagnosis not present

## 2015-07-10 DIAGNOSIS — M25579 Pain in unspecified ankle and joints of unspecified foot: Secondary | ICD-10-CM

## 2015-07-10 DIAGNOSIS — K5900 Constipation, unspecified: Secondary | ICD-10-CM | POA: Insufficient documentation

## 2015-07-10 DIAGNOSIS — R1900 Intra-abdominal and pelvic swelling, mass and lump, unspecified site: Secondary | ICD-10-CM | POA: Diagnosis not present

## 2015-07-10 LAB — COMPREHENSIVE METABOLIC PANEL
ALT: 7 U/L — AB (ref 14–54)
ANION GAP: 13 (ref 5–15)
AST: 15 U/L (ref 15–41)
Albumin: 3.5 g/dL (ref 3.5–5.0)
Alkaline Phosphatase: 83 U/L (ref 38–126)
BUN: 8 mg/dL (ref 6–20)
CHLORIDE: 91 mmol/L — AB (ref 101–111)
CO2: 27 mmol/L (ref 22–32)
CREATININE: 0.95 mg/dL (ref 0.44–1.00)
Calcium: 9.2 mg/dL (ref 8.9–10.3)
Glucose, Bld: 117 mg/dL — ABNORMAL HIGH (ref 65–99)
POTASSIUM: 3.5 mmol/L (ref 3.5–5.1)
Sodium: 131 mmol/L — ABNORMAL LOW (ref 135–145)
Total Bilirubin: 0.3 mg/dL (ref 0.3–1.2)
Total Protein: 8.1 g/dL (ref 6.5–8.1)

## 2015-07-10 LAB — CBC
HCT: 26.5 % — ABNORMAL LOW (ref 35.0–47.0)
HEMOGLOBIN: 8.7 g/dL — AB (ref 12.0–16.0)
MCH: 25.6 pg — ABNORMAL LOW (ref 26.0–34.0)
MCHC: 33 g/dL (ref 32.0–36.0)
MCV: 77.6 fL — AB (ref 80.0–100.0)
PLATELETS: 385 10*3/uL (ref 150–440)
RBC: 3.41 MIL/uL — AB (ref 3.80–5.20)
RDW: 17.3 % — ABNORMAL HIGH (ref 11.5–14.5)
WBC: 6.8 10*3/uL (ref 3.6–11.0)

## 2015-07-10 LAB — LIPASE, BLOOD: LIPASE: 30 U/L (ref 11–51)

## 2015-07-10 LAB — URINALYSIS COMPLETE WITH MICROSCOPIC (ARMC ONLY)
BILIRUBIN URINE: NEGATIVE
Bacteria, UA: NONE SEEN
Glucose, UA: NEGATIVE mg/dL
LEUKOCYTES UA: NEGATIVE
Nitrite: NEGATIVE
PH: 6 (ref 5.0–8.0)
Protein, ur: 30 mg/dL — AB
Specific Gravity, Urine: 1.005 (ref 1.005–1.030)

## 2015-07-10 MED ORDER — HYDROCODONE-ACETAMINOPHEN 5-325 MG PO TABS
1.0000 | ORAL_TABLET | ORAL | Status: AC
  Administered 2015-07-10: 1 via ORAL
  Filled 2015-07-10: qty 1

## 2015-07-10 MED ORDER — ONDANSETRON HCL 4 MG/2ML IJ SOLN
4.0000 mg | Freq: Once | INTRAMUSCULAR | Status: AC
  Administered 2015-07-10: 4 mg via INTRAVENOUS
  Filled 2015-07-10: qty 2

## 2015-07-10 MED ORDER — IOHEXOL 240 MG/ML SOLN
25.0000 mL | INTRAMUSCULAR | Status: AC
  Administered 2015-07-10: 25 mL via ORAL

## 2015-07-10 MED ORDER — IOHEXOL 300 MG/ML  SOLN
100.0000 mL | Freq: Once | INTRAMUSCULAR | Status: AC | PRN
  Administered 2015-07-10: 100 mL via INTRAVENOUS

## 2015-07-10 MED ORDER — HYDROCODONE-ACETAMINOPHEN 5-325 MG PO TABS
2.0000 | ORAL_TABLET | ORAL | Status: AC
  Administered 2015-07-10: 2 via ORAL
  Filled 2015-07-10: qty 2

## 2015-07-10 NOTE — ED Notes (Signed)
Pt states she was sent over by Dr Carylon Perches, [facility] for further eval of possible bowel blockage and possible dvt to left leg. Pt states swelling in left leg radiating up into thigh area, also has not had bowel movement in two weeks.  Pt with hx of abd surgery back in feb with a mass removed then.

## 2015-07-10 NOTE — ED Provider Notes (Signed)
[facility] Emergency Department Provider Note REMINDER - THIS NOTE IS NOT A FINAL MEDICAL RECORD UNTIL IT IS SIGNED. UNTIL THEN, THE CONTENT BELOW MAY REFLECT INFORMATION FROM A DOCUMENTATION TEMPLATE, NOT THE ACTUAL PATIENT VISIT. ____________________________________________  Time seen: Approximately 4:57 PM  I have reviewed the triage vital signs and the nursing notes.   HISTORY  Chief Complaint GI Problem    HPI Kathy Wagner is a 64 y.o. female the complex history including hysterectomy and recent brain surgery due to metastatic carcinoma.  Patient reports that since about June she is had swelling in the left leg that comes and goes, she's had this evaluated before and it is thought possibly due to her previous blood clots as well as from pelvic surgery from her report. She has noticed that over the last month her left leg has continued to be swollen, in addition she reported history of primary care doctor who advised that she should get an ultrasound to rule out a blood clot.  Patient's second concern is that for approximately one month she has had slow bowel movements, but she is frequently constipated and having to take over-the-counter laxatives including MiraLAX and magnesium citrate. She reports for the last 2 weeks she has not had a bowel movement except for some occasionally very small movements. She denies being in significant pain except she feels bloated. No fevers or chills, no nausea or vomiting.  No chest pain or trouble breathing. She denies any numbness or weakness of the lower extremities or leg. She's not had a blue or cold foot. In addition she has not had any headache, confusion, or visual changes. No numbness or tingling.   Past Medical History  Diagnosis Date  . Anemia     IDA  . Brain surgery within last 3 months     12/14/14  . Radiation   . Endometrial cancer ([place])   . History of radiation therapy   . History of chemotherapy    . Diverticulosis   . GERD (gastroesophageal reflux disease)     Patient Active Problem List   Diagnosis Date Noted  . Edema of left lower extremity 05/27/2015  . Below normal amount of sodium in the blood 12/15/2014  . Brain metastases ([place]) 12/15/2014  . Brain mass 12/12/2014  . Vaginal bleeding 02/15/2014  . Acute blood loss anemia 02/15/2014  . Endometrial cancer ([place]) 02/15/2014  . Anemia, blood loss 02/15/2014  . Depression with anxiety 02/15/2014  . Anorexia 02/15/2014    Past Surgical History  Procedure Laterality Date  . Ovarian cyst removal  x 3    BTL, surgery for ovarian cysts in 1970, 1972, 1992  . Total laparoscopic hysterectomy/right salpingoophorectomy    . Tubal ligation      BTL, surgery for ovarian cysts in 1970, 1972, 1992  . Brain surgery    . Abdominal hysterectomy      Current Outpatient Rx  Name  Route  Sig  Dispense  Refill  . furosemide (LASIX) 20 MG tablet   Oral   Take 20 mg by mouth daily.         . ciprofloxacin (CIPRO) 500 MG tablet   Oral   Take 1 tablet (500 mg total) by mouth 2 (two) times daily. Patient not taking: Reported on 05/27/2015   14 tablet   0   . docusate sodium (COLACE) 100 MG capsule   Oral   Take 100 mg by mouth daily as needed for mild constipation.         Marland Kitchen  FLUoxetine (PROZAC) 20 MG capsule               . HYDROcodone-acetaminophen (NORCO/VICODIN) 5-325 MG tablet   Oral   Take 1 tablet by mouth every 6 (six) hours as needed for moderate pain. Patient not taking: Reported on 07/10/2015   60 tablet   0   . mirabegron ER (MYRBETRIQ) 25 MG TB24 tablet   Oral   Take 1 tablet (25 mg total) by mouth daily.   30 tablet   11   . omeprazole (PRILOSEC) 20 MG capsule   Oral   Take 20 mg by mouth daily.         Marland Kitchen oxymetazoline (AFRIN) 0.05 % nasal spray   Each Nare   Place 1 spray into both nostrils 2 (two) times daily.         Marland Kitchen oxymorphone (OPANA ER) 5 MG 12 hr tablet   Oral   Take 1 tablet (5  mg total) by mouth every 12 (twelve) hours.   60 tablet   0   . oxymorphone (OPANA) 5 MG tablet   Oral   Take 1 tablet (5 mg total) by mouth every 4 (four) hours as needed for pain.   60 tablet   0   . promethazine (PHENERGAN) 25 MG tablet   Oral   Take 1 tablet (25 mg total) by mouth every 6 (six) hours as needed for nausea or vomiting.   60 tablet   0   . sennosides-docusate sodium (SENOKOT-S) 8.6-50 MG tablet   Oral   Take 1 tablet by mouth at bedtime.         . sodium chloride (OCEAN) 0.65 % SOLN nasal spray   Each Nare   Place 1 spray into both nostrils as needed for congestion.           Allergies Azithromycin; Codeine; Gluten meal; Hydromorphone hcl; Lorazepam; Oxycodone; Soy allergy; and Tramadol  Family History  Problem Relation Age of Onset  . Hypertension Mother   . Hypertension Father   . Hematuria Neg Hx   . Kidney cancer Neg Hx   . Bladder Cancer Neg Hx     Social History Social History  Substance Use Topics  . Smoking status: Never Smoker   . Smokeless tobacco: Never Used  . Alcohol Use: No    Review of Systems Constitutional: No fever/chills Eyes: No visual changes. ENT: No sore throat. Cardiovascular: Denies chest pain. Respiratory: Denies shortness of breath. Gastrointestinal: No vomiting. No diarrhea. Chronic constipation, seemingly worse over the last 1 month and no bowel movement for about 2 weeks except for some very small amounts of stool. Genitourinary: Negative for dysuria. Musculoskeletal: Negative for back pain. Skin: Negative for rash. Neurological: Negative for headaches, focal weakness or numbness.  10-point ROS otherwise negative.  ____________________________________________   PHYSICAL EXAM:  VITAL SIGNS: ED Triage Vitals  Enc Vitals Group     BP 07/10/15 1238 141/104 mmHg     Pulse Rate 07/10/15 1238 94     Resp 07/10/15 1238 20     Temp 07/10/15 1238 98.4 F (36.9 C)     Temp Source 07/10/15 1238 Oral      SpO2 07/10/15 1238 98 %     Weight 07/10/15 1242 160 lb (72.576 kg)     Height 07/10/15 1242 5\' 6"  (1.676 m)     Head Cir --      Peak Flow --      Pain Score --  Pain Loc --      Pain Edu? --      Excl. in [place]? --    Constitutional: Alert and oriented. Well appearing and in no acute distress. Eyes: Conjunctivae are normal. PERRL. EOMI. Head: Atraumatic. Nose: No congestion/rhinnorhea. Mouth/Throat: Mucous membranes are moist.  Oropharynx non-erythematous. Neck: No stridor.   Cardiovascular: Normal rate, regular rhythm. Grossly normal heart sounds.  Good peripheral circulation. Respiratory: Normal respiratory effort.  No retractions. Lungs CTAB. Gastrointestinal: Soft and nontender except for some mild tenderness in the left lower quadrant without rebound or guarding. No distention. No abdominal bruits. Old trocar sites are clean and well healing. No hernias noted. Musculoskeletal: No lower extremity tenderness and no edema in the right lower extremity, there is 2+ lower extremity pitting edema on the left without evidence of cords or venous stasis.  No joint effusions. Neurologic:  Normal speech and language. No gross focal neurologic deficits are appreciated. No gait instability, able to ambulate back and forth to the commode without difficulty. Skin:  Skin is warm, dry and intact. No rash noted. Psychiatric: Mood and affect are normal. Speech and behavior are normal.  ____________________________________________   LABS (all labs ordered are listed, but only abnormal results are displayed)  Labs Reviewed  COMPREHENSIVE METABOLIC PANEL - Abnormal; Notable for the following:    Sodium 131 (*)    Chloride 91 (*)    Glucose, Bld 117 (*)    ALT 7 (*)    All other components within normal limits  CBC - Abnormal; Notable for the following:    RBC 3.41 (*)    Hemoglobin 8.7 (*)    HCT 26.5 (*)    MCV 77.6 (*)    MCH 25.6 (*)    RDW 17.3 (*)    All other components within  normal limits  URINALYSIS COMPLETEWITH MICROSCOPIC ([facility] ONLY) - Abnormal; Notable for the following:    Color, Urine STRAW (*)    APPearance CLEAR (*)    Ketones, ur TRACE (*)    Hgb urine dipstick 2+ (*)    Protein, ur 30 (*)    Squamous Epithelial / LPF 0-5 (*)    All other components within normal limits  LIPASE, BLOOD   ____________________________________________  EKG  Reviewed and interpreted by me Slight baseline artifact Ventricular rate 90 PR 1:30 QTc 420 Intervals are notable for mild increased full dig possible LVH No acute ST abnormality is noted, no T-wave inversions except as in V2 which is not an unusual finding and likely nonpathologic No evidence of ST elevation or significant ST depressions though there is some very slight artifact present.  Again, the patient has no cardiopulmonary symptoms at this time. She complains of no upper abdominal discomfort or pain. ____________________________________________  RADIOLOGY   IMPRESSION: 1. Significant interval enlargement of the previously demonstrated left pelvic mass, currently measuring 6.8 x 6.8 cm, compatible with local metastatic disease or residual primary malignancy. This is involving the pelvic side wall, including the left external iliac artery and vein. Based on the patient's left leg swelling, this is most likely occluding the external iliac vein. 2. Moderate-to-marked left hydronephrosis and hydroureter with mild progression, due to obstruction of the ureter by the left pelvic mass. 3. Interval 3.9 x 3.9 cm midline lower pelvic fluid collection with mild peripheral rim enhancement, suspicious for an abscess. 4. High-grade partial obstruction of the distal colon bowel the left pelvic mass. 5. Cholelithiasis without evidence of cholecystitis. 6. Small pericardial effusion. 7. Interval  mild dilatation of the distal esophagus or small sliding hiatal hernia, most likely containing  fluid.   IMPRESSION: 1. 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 2. No evidence of acute or occlusive DVT. 3. Superficial thrombophlebitis involving the greater saphenous vein in the proximal thigh. ____________________________________________   PROCEDURES  Procedure(s) performed: None  Critical Care performed: No  ____________________________________________   INITIAL IMPRESSION / ASSESSMENT AND PLAN / ED COURSE  Pertinent labs & imaging results that were available during my care of the patient were reviewed by me and considered in my medical decision making (see chart for details).  Patient presents for one swelling in the left lower leg which is been somewhat chronic since the time of surgery and a cancer diagnosis. Does not appear to be any neurovascular compromise, but the patient does have 2+ lower extremity edema. We will evaluate with ultrasound to exclude DVT.  Regarding the patient's abdominal complaints, she seems to be suffering from symptoms of severe constipation and has been following with her primary care doctor regarding this however she also reports she is barely stooling for the last 2 weeks and with a history of previous surgeries and some mild left-sided tenderness we will obtain a CT to rule out a bowel obstruction, mass, or other intra-abdominal pathology to explain her symptoms. I discussed with the patient and I suspect that her use of multiple narcotics may be contributing to her severe constipation, patient notes that she has been told this in the past and has continued on heavy amounts of laxatives but still developed severe constipation from time to time, and at this point it seems to be moderate to severe.  ----------------------------------------- 7:12 PM on 07/10/2015 -----------------------------------------  Review CAT scan findings with the patient, appears that she has recurrence of pelvic mass which seems to be  causing symptoms of constipation as well as left leg swelling. I discussed with her, we discussed options of ongoing care including evaluation by gynecology here versus transfer to [facility] to see her primary gynecology team. The patient expresses that she would likely like to be transferred to [facility], but first requested I discussed with the GYN here and review CT. ____________________________________________  ----------------------------------------- 9:02 PM on 07/10/2015 -----------------------------------------  Patient agreeable with transfer to [facility] for specialized gynecologic oncology care. Dr. Quenten Raven of our on-call GYN service advises that specialty services beyond his D are required, and I would agree with this.  Patient and her husband both agreeable to transfer, understands plan of care for ER to ER transfer will be seen and evaluated by accepting physician Dr. Drue Flirt ----------------------------------------- 9:03 PM on 07/10/2015 -----------------------------------------   At this time she is hemodynamic was stable, alert and oriented in no distress.   FINAL CLINICAL IMPRESSION(S) / ED DIAGNOSES  Pelvic mass High-grade partial large Bowel obstruction Chronic left lower extremity edema  Patient accepted in transfer to [facility], MD 07/10/15 2104

## 2015-07-10 NOTE — ED Notes (Signed)
Patient transported to CT 

## 2015-07-10 NOTE — ED Notes (Signed)
Says sent here by her doctor forr bowel blockage.  Has had several tests.

## 2015-07-13 ENCOUNTER — Encounter: Payer: 59 | Admitting: Occupational Therapy

## 2015-07-14 ENCOUNTER — Ambulatory Visit: Payer: 59 | Admitting: Occupational Therapy

## 2015-07-15 ENCOUNTER — Encounter: Payer: 59 | Admitting: Occupational Therapy

## 2015-07-15 ENCOUNTER — Ambulatory Visit: Payer: 59 | Admitting: Occupational Therapy

## 2015-07-17 ENCOUNTER — Ambulatory Visit: Payer: 59 | Admitting: Occupational Therapy

## 2015-07-17 ENCOUNTER — Encounter: Payer: 59 | Admitting: Occupational Therapy

## 2015-07-20 ENCOUNTER — Encounter: Payer: 59 | Admitting: Occupational Therapy

## 2015-07-21 ENCOUNTER — Other Ambulatory Visit: Payer: 59

## 2015-07-21 ENCOUNTER — Ambulatory Visit: Payer: 59

## 2015-07-22 ENCOUNTER — Ambulatory Visit: Payer: 59

## 2015-07-23 ENCOUNTER — Encounter: Payer: 59 | Admitting: Occupational Therapy

## 2015-07-24 ENCOUNTER — Encounter: Payer: 59 | Admitting: Occupational Therapy

## 2015-07-27 ENCOUNTER — Encounter: Payer: 59 | Admitting: Occupational Therapy

## 2015-07-29 ENCOUNTER — Encounter: Payer: 59 | Admitting: Occupational Therapy

## 2015-07-31 ENCOUNTER — Encounter: Payer: 59 | Admitting: Occupational Therapy

## 2015-08-03 ENCOUNTER — Encounter: Payer: 59 | Admitting: Occupational Therapy

## 2015-08-05 ENCOUNTER — Encounter: Payer: 59 | Admitting: Occupational Therapy

## 2015-08-06 DEATH — deceased

## 2015-08-07 ENCOUNTER — Encounter: Payer: 59 | Admitting: Occupational Therapy

## 2015-08-10 ENCOUNTER — Encounter: Payer: 59 | Admitting: Occupational Therapy

## 2015-08-12 ENCOUNTER — Encounter: Payer: 59 | Admitting: Occupational Therapy

## 2015-08-14 ENCOUNTER — Encounter: Payer: 59 | Admitting: Occupational Therapy

## 2015-08-17 ENCOUNTER — Encounter: Payer: 59 | Admitting: Occupational Therapy

## 2015-08-19 ENCOUNTER — Encounter: Payer: 59 | Admitting: Occupational Therapy

## 2015-08-21 ENCOUNTER — Encounter: Payer: 59 | Admitting: Occupational Therapy

## 2015-08-24 ENCOUNTER — Encounter: Payer: 59 | Admitting: Occupational Therapy

## 2015-08-26 ENCOUNTER — Encounter: Payer: 59 | Admitting: Occupational Therapy

## 2015-08-28 ENCOUNTER — Encounter: Payer: 59 | Admitting: Occupational Therapy

## 2017-03-07 NOTE — Telephone Encounter (Signed)
done
